# Patient Record
Sex: Female | Born: 1990 | Hispanic: No | Marital: Single | Smoking: Former smoker
Health system: Southern US, Community
[De-identification: ages and names within clinical notes are randomized; demographics above are authoritative.]

## PROBLEM LIST (undated history)

## (undated) ENCOUNTER — Inpatient Hospital Stay (HOSPITAL_COMMUNITY): Payer: Self-pay

## (undated) DIAGNOSIS — R87629 Unspecified abnormal cytological findings in specimens from vagina: Secondary | ICD-10-CM

## (undated) HISTORY — DX: Unspecified abnormal cytological findings in specimens from vagina: R87.629

## (undated) HISTORY — PX: NO PAST SURGERIES: SHX2092

---

## 2014-01-19 ENCOUNTER — Emergency Department (HOSPITAL_COMMUNITY)
Admission: EM | Admit: 2014-01-19 | Discharge: 2014-01-19 | Disposition: A | Discharge status: Home / Self Care | Payer: Self-pay | Attending: Emergency Medicine | Admitting: Emergency Medicine

## 2014-01-19 ENCOUNTER — Encounter (HOSPITAL_COMMUNITY): Payer: Self-pay | Admitting: *Deleted

## 2014-01-19 DIAGNOSIS — J029 Acute pharyngitis, unspecified: Secondary | ICD-10-CM

## 2014-01-19 DIAGNOSIS — J039 Acute tonsillitis, unspecified: Secondary | ICD-10-CM

## 2014-01-19 LAB — RAPID STREP SCREEN (MED CTR MEBANE ONLY): Streptococcus, Group A Screen (Direct): NEGATIVE

## 2014-01-19 MED ORDER — CHLORASEPTIC SALT WATER GARGLE MT LIQD: OROMUCOSAL | Status: DC

## 2014-01-19 NOTE — ED Notes (Signed)
Pt reports sore throat x2 days. Pain 9/10.

## 2014-01-19 NOTE — ED Provider Notes (Signed)
CSN: [id number]     Arrival date & time 01/19/14  1507 History  This chart was scribed for non-physician practitioner working with [name], MD by Richarda Overlieichard Holland, ED Scribe. This patient was seen in room WTR5/WTR5 and the patient's care was started at 3:34 PM.      Chief Complaint  Patient presents with  . Sore Throat   The history is provided by the patient. No language interpreter was used.   HPI Comments: Robin Webster is a 23 y.o. female who presents to the Emergency Department complaining of constant, gradually worsening sore throat that started last night. Pt reports her pain is a 9/10, worse with swallowing. She reports associated cold chills as symptom. She states she has not taken any medication. She denies fever, congestion and cough as symptoms.    History reviewed. No pertinent past medical history. No past surgical history on file. History reviewed. No pertinent family history. History  Substance Use Topics  . Smoking status: Not on file  . Smokeless tobacco: Not on file  . Alcohol Use: Not on file   OB History    No data available     Review of Systems  Constitutional: Positive for chills.  HENT: Positive for sore throat.   All other systems reviewed and are negative.     Allergies  Review of patient's allergies indicates no known allergies.  Home Medications   Prior to Admission medications   Medication Sig Start Date End Date Taking? Authorizing Provider  Misc. Throat Products [facility](CHLORASEPTIC SALT WATER GARGLE) LIQD Swish and gargle, spit. 01/19/14   Lucrezia Dehne M Karlee Staff, PA-C   BP 114/68 mmHg  Pulse 98  Temp(Src) 99.2 F (37.3 C) (Oral)  Resp 16  SpO2 99%  LMP 12/29/2013 Physical Exam  Constitutional: She is oriented to person, place, and time. She appears well-developed and well-nourished. No distress.  HENT:  Head: Normocephalic and atraumatic.  Tonsils enlarged and inflamed +1 with few exudate. No uvula swelling. Nasal congestion, mucosal edema,  post-nasal drip.  Eyes: Conjunctivae and EOM are normal.  Neck: Normal range of motion. Neck supple.  Cardiovascular: Normal rate, regular rhythm and normal heart sounds.   Pulmonary/Chest: Effort normal and breath sounds normal. No respiratory distress.  Musculoskeletal: Normal range of motion. She exhibits no edema.  Lymphadenopathy:    She has no cervical adenopathy.  Neurological: She is alert and oriented to person, place, and time. No sensory deficit.  Skin: Skin is warm and dry.  Psychiatric: She has a normal mood and affect. Her behavior is normal.  Nursing note and vitals reviewed.   ED Course  Procedures   DIAGNOSTIC STUDIES: Oxygen Saturation is 99% on RA, normal by my interpretation.    COORDINATION OF CARE: 4:02 PM Discussed treatment plan with pt at bedside and pt agreed to plan.   Labs Review Labs Reviewed  RAPID STREP SCREEN  CULTURE, GROUP A STREP    Imaging Review No results found.   EKG Interpretation None      MDM   Final diagnoses:  Tonsillitis  Sore throat   Patient presenting with sore throat. Rapid strep negative. She does not meet [facility] criteria for strep throat treatment. Advised Chloraseptic saltwater gargles, nasal saline, ibuprofen. Stable for discharge. Return precautions given. Patient states understanding of treatment care plan and is agreeable.  I personally performed the services described in this documentation, which was scribed in my presence. The recorded information has been reviewed and is accurate.    Melina Schoolsobyn  Marijo FileM Medhansh Brinkmeier, PA-C 01/19/14 1614  [name], MD 01/19/14 [phone]

## 2014-01-19 NOTE — Discharge Instructions (Signed)
Use Chloraseptic saltwater gargles along with nasal saline as directed. Stay well-hydrated.  Sore Throat A sore throat is pain, burning, irritation, or scratchiness of the throat. There is often pain or tenderness when swallowing or talking. A sore throat may be accompanied by other symptoms, such as coughing, sneezing, fever, and swollen neck glands. A sore throat is often the first sign of another sickness, such as a cold, flu, strep throat, or mononucleosis (commonly known as mono). Most sore throats go away without medical treatment. CAUSES  The most common causes of a sore throat include:  A viral infection, such as a cold, flu, or mono.  A bacterial infection, such as strep throat, tonsillitis, or whooping cough.  Seasonal allergies.  Dryness in the air.  Irritants, such as smoke or pollution.  Gastroesophageal reflux disease (GERD). HOME CARE INSTRUCTIONS   Only take over-the-counter medicines as directed by your caregiver.  Drink enough fluids to keep your urine clear or pale yellow.  Rest as needed.  Try using throat sprays, lozenges, or sucking on hard candy to ease any pain (if older than 4 years or as directed).  Sip warm liquids, such as broth, herbal tea, or warm water with honey to relieve pain temporarily. You may also eat or drink cold or frozen liquids such as frozen ice pops.  Gargle with salt water (mix 1 tsp salt with 8 oz of water).  Do not smoke and avoid secondhand smoke.  Put a cool-mist humidifier in your bedroom at night to moisten the air. You can also turn on a hot shower and sit in the bathroom with the door closed for 5-10 minutes. SEEK IMMEDIATE MEDICAL CARE IF:  You have difficulty breathing.  You are unable to swallow fluids, soft foods, or your saliva.  You have increased swelling in the throat.  Your sore throat does not get better in 7 days.  You have nausea and vomiting.  You have a fever or persistent symptoms for more than 2-3  days.  You have a fever and your symptoms suddenly get worse. MAKE SURE YOU:   Understand these instructions.  Will watch your condition.  Will get help right away if you are not doing well or get worse. Document Released: 04/11/2004 Document Revised: 02/19/2012 Document Reviewed: 11/10/2011 [facility] Patient Information 2015 [employer], [place]. This information is not intended to replace advice given to you by your health care provider. Make sure you discuss any questions you have with your health care provider.  Tonsillitis Tonsillitis is an infection of the throat that causes the tonsils to become red, tender, and swollen. Tonsils are collections of lymphoid tissue at the back of the throat. Each tonsil has crevices (crypts). Tonsils help fight nose and throat infections and keep infection from spreading to other parts of the body for the first 18 months of life.  CAUSES Sudden (acute) tonsillitis is usually caused by infection with streptococcal bacteria. Long-lasting (chronic) tonsillitis occurs when the crypts of the tonsils become filled with pieces of food and bacteria, which makes it easy for the tonsils to become repeatedly infected. SYMPTOMS  Symptoms of tonsillitis include:  A sore throat, with possible difficulty swallowing.  White patches on the tonsils.  Fever.  Tiredness.  New episodes of snoring during sleep, when you did not snore before.  Small, foul-smelling, yellowish-white pieces of material (tonsilloliths) that you occasionally cough up or spit out. The tonsilloliths can also cause you to have bad breath. DIAGNOSIS Tonsillitis can be diagnosed through a  physical exam. Diagnosis can be confirmed with the results of lab tests, including a throat culture. TREATMENT  The goals of tonsillitis treatment include the reduction of the severity and duration of symptoms and prevention of associated conditions. Symptoms of tonsillitis can be improved with the use of steroids  to reduce the swelling. Tonsillitis caused by bacteria can be treated with antibiotic medicines. Usually, treatment with antibiotic medicines is started before the cause of the tonsillitis is known. However, if it is determined that the cause is not bacterial, antibiotic medicines will not treat the tonsillitis. If attacks of tonsillitis are severe and frequent, your health care provider may recommend surgery to remove the tonsils (tonsillectomy). HOME CARE INSTRUCTIONS   Rest as much as possible and get plenty of sleep.  Drink plenty of fluids. While the throat is very sore, eat soft foods or liquids, such as sherbet, soups, or instant breakfast drinks.  Eat frozen ice pops.  Gargle with a warm or cold liquid to help soothe the throat. Mix 1/4 teaspoon of salt and 1/4 teaspoon of baking soda in 8 oz of water. SEEK MEDICAL CARE IF:   Large, tender lumps develop in your neck.  A rash develops.  A green, yellow-brown, or bloody substance is coughed up.  You are unable to swallow liquids or food for 24 hours.  You notice that only one of the tonsils is swollen. SEEK IMMEDIATE MEDICAL CARE IF:   You develop any new symptoms such as vomiting, severe headache, stiff neck, chest pain, or trouble breathing or swallowing.  You have severe throat pain along with drooling or voice changes.  You have severe pain, unrelieved with recommended medications.  You are unable to fully open the mouth.  You develop redness, swelling, or severe pain anywhere in the neck.  You have a fever. MAKE SURE YOU:   Understand these instructions.  Will watch your condition.  Will get help right away if you are not doing well or get worse. Document Released: 12/12/2004 Document Revised: 07/19/2013 Document Reviewed: 08/21/2012 [facility] Patient Information 2015 [employer], [place]. This information is not intended to replace advice given to you by your health care provider. Make sure you discuss any questions  you have with your health care provider.

## 2014-01-21 LAB — CULTURE, GROUP A STREP

## 2014-10-01 ENCOUNTER — Encounter (HOSPITAL_COMMUNITY): Payer: Self-pay | Admitting: *Deleted

## 2014-10-01 ENCOUNTER — Emergency Department (HOSPITAL_COMMUNITY)
Admission: EM | Admit: 2014-10-01 | Discharge: 2014-10-01 | Disposition: A | Discharge status: Home / Self Care | Payer: Self-pay | Attending: Emergency Medicine | Admitting: Emergency Medicine

## 2014-10-01 DIAGNOSIS — S199XXA Unspecified injury of neck, initial encounter: Secondary | ICD-10-CM | POA: Insufficient documentation

## 2014-10-01 DIAGNOSIS — Y999 Unspecified external cause status: Secondary | ICD-10-CM | POA: Insufficient documentation

## 2014-10-01 DIAGNOSIS — S3991XA Unspecified injury of abdomen, initial encounter: Secondary | ICD-10-CM | POA: Insufficient documentation

## 2014-10-01 DIAGNOSIS — Y939 Activity, unspecified: Secondary | ICD-10-CM | POA: Insufficient documentation

## 2014-10-01 DIAGNOSIS — Y92410 Unspecified street and highway as the place of occurrence of the external cause: Secondary | ICD-10-CM | POA: Insufficient documentation

## 2014-10-01 DIAGNOSIS — S0990XA Unspecified injury of head, initial encounter: Secondary | ICD-10-CM | POA: Insufficient documentation

## 2014-10-01 DIAGNOSIS — S299XXA Unspecified injury of thorax, initial encounter: Secondary | ICD-10-CM | POA: Insufficient documentation

## 2014-10-01 MED ORDER — METHOCARBAMOL 500 MG PO TABS
500.0000 mg | ORAL_TABLET | Freq: Once | ORAL | Status: AC
  Administered 2014-10-01: 500 mg via ORAL
  Filled 2014-10-01: qty 1

## 2014-10-01 MED ORDER — NAPROXEN 500 MG PO TABS: 500.0000 mg | ORAL_TABLET | Freq: Two times a day (BID) | ORAL | Status: DC

## 2014-10-01 MED ORDER — METHOCARBAMOL 500 MG PO TABS: 500.0000 mg | ORAL_TABLET | Freq: Two times a day (BID) | ORAL | Status: DC

## 2014-10-01 MED ORDER — OXYCODONE-ACETAMINOPHEN 5-325 MG PO TABS: 2.0000 | ORAL_TABLET | ORAL | Status: DC | PRN

## 2014-10-01 MED ORDER — OXYCODONE-ACETAMINOPHEN 5-325 MG PO TABS
2.0000 | ORAL_TABLET | Freq: Once | ORAL | Status: AC
  Administered 2014-10-01: 2 via ORAL
  Filled 2014-10-01: qty 2
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
## 2015-01-18 ENCOUNTER — Encounter (HOSPITAL_COMMUNITY): Payer: Self-pay | Admitting: *Deleted

## 2015-01-18 ENCOUNTER — Emergency Department (HOSPITAL_COMMUNITY)
Admission: EM | Admit: 2015-01-18 | Discharge: 2015-01-18 | Disposition: A | Discharge status: Home / Self Care | Payer: Self-pay | Attending: Emergency Medicine | Admitting: Emergency Medicine

## 2015-01-18 DIAGNOSIS — B9689 Other specified bacterial agents as the cause of diseases classified elsewhere: Secondary | ICD-10-CM

## 2015-01-18 DIAGNOSIS — N760 Acute vaginitis: Secondary | ICD-10-CM | POA: Insufficient documentation

## 2015-01-18 DIAGNOSIS — Z3202 Encounter for pregnancy test, result negative: Secondary | ICD-10-CM | POA: Insufficient documentation

## 2015-01-18 DIAGNOSIS — A5901 Trichomonal vulvovaginitis: Secondary | ICD-10-CM | POA: Insufficient documentation

## 2015-01-18 LAB — URINE MICROSCOPIC-ADD ON

## 2015-01-18 LAB — URINALYSIS, ROUTINE W REFLEX MICROSCOPIC
Bilirubin Urine: NEGATIVE
Glucose, UA: NEGATIVE mg/dL
Hgb urine dipstick: NEGATIVE
Ketones, ur: NEGATIVE mg/dL
Nitrite: NEGATIVE
Protein, ur: NEGATIVE mg/dL
Specific Gravity, Urine: 1.018 (ref 1.005–1.030)
Urobilinogen, UA: 1 mg/dL (ref 0.0–1.0)
pH: 6.5 (ref 5.0–8.0)

## 2015-01-18 LAB — POC URINE PREG, ED: Preg Test, Ur: NEGATIVE

## 2015-01-18 LAB — WET PREP, GENITAL: Yeast Wet Prep HPF POC: NONE SEEN

## 2015-01-18 MED ORDER — METRONIDAZOLE 500 MG PO TABS: 500.0000 mg | ORAL_TABLET | Freq: Two times a day (BID) | ORAL | Status: DC

## 2015-01-18 NOTE — ED Provider Notes (Signed)
CSN: [id number]     Arrival date & time 01/18/15  1926 History  By signing my name below, I, Robin Webster, attest that this documentation has been prepared under the direction and in the presence of Carrington Mullenax, PA-C. Electronically Signed: Ronney LionSuzanne Webster, ED Scribe. 01/18/2015. 8:23 PM.    Chief Complaint  Patient presents with  . Vaginal Itching   The history is provided by the patient. No language interpreter was used.    HPI Comments: Robin Webster is a 24 y.o. female who presents to the Emergency Department complaining of constant, gradually worsening vaginal itching, redness, and pain that began 3 days ago. Walking exacerbates her pain. She denies dysuria but states that when she urinates and the urine stream touches the irritated area, it makes her pain worse. Nothing makes her pain better. She denies trying any prior treatments or medications for this. She wore a new pair of lace underwear immediately before the irritation began. She also denies vaginal discharge or bleeding, abdominal pain, nausea, vomiting, myalgias, chest pain, or fever.   History reviewed. No pertinent past medical history. History reviewed. No pertinent past surgical history. No family history on file. Social History  Substance Use Topics  . Smoking status: Never Smoker   . Smokeless tobacco: None  . Alcohol Use: Yes   OB History    No data available     Review of Systems  Constitutional: Negative for fever, chills and diaphoresis.  Cardiovascular: Negative for chest pain.  Gastrointestinal: Negative for nausea, vomiting and abdominal pain.  Genitourinary: Positive for vaginal pain. Negative for dysuria, hematuria, flank pain, vaginal bleeding, vaginal discharge and genital sores.  Musculoskeletal: Negative for myalgias and back pain.  Skin: Negative for rash.  All other systems reviewed and are negative.   Allergies  Review of patient's allergies indicates no known allergies.  Home Medications    Prior to Admission medications   Medication Sig Start Date End Date Taking? Authorizing Provider  methocarbamol (ROBAXIN) 500 MG tablet Take 1 tablet (500 mg total) by mouth 2 (two) times daily. Patient not taking: Reported on 01/18/2015 10/01/14   Catha GosselinHanna Patel-Mills, PA-C  metroNIDAZOLE (FLAGYL) 500 MG tablet Take 1 tablet (500 mg total) by mouth 2 (two) times daily. 01/18/15   Yamaris Cummings, PA-C  Misc. Throat Products [facility](CHLORASEPTIC SALT WATER GARGLE) LIQD Swish and gargle, spit. Patient not taking: Reported on 01/18/2015 01/19/14   Kathrynn Speedobyn M Hess, PA-C  naproxen (NAPROSYN) 500 MG tablet Take 1 tablet (500 mg total) by mouth 2 (two) times daily. Patient not taking: Reported on 01/18/2015 10/01/14   Catha GosselinHanna Patel-Mills, PA-C  oxyCODONE-acetaminophen (PERCOCET/ROXICET) 5-325 MG per tablet Take 2 tablets by mouth every 4 (four) hours as needed for severe pain. Patient not taking: Reported on 01/18/2015 10/01/14   Hanna Patel-Mills, PA-C   BP 131/92 mmHg  Pulse 76  Temp(Src) 98.3 F (36.8 C) (Oral)  Resp 17  Ht 5\' 5"  (1.651 m)  Wt 156 lb 14.4 oz (71.169 kg)  BMI 26.11 kg/m2  SpO2 100%  LMP 12/18/2014 Physical Exam  Constitutional: She is oriented to person, place, and time. She appears well-developed and well-nourished. No distress.  HENT:  Head: Normocephalic and atraumatic.  Eyes: Conjunctivae and EOM are normal.  Neck: Neck supple. No tracheal deviation present.  Cardiovascular: Normal rate and normal heart sounds.   Pulmonary/Chest: Effort normal and breath sounds normal. No respiratory distress. She has no wheezes.  Abdominal: Soft. She exhibits no distension. There is no tenderness.  Genitourinary:  Uterus normal.    There is no rash or lesion on the right labia. There is no rash or lesion on the left labia. Cervix exhibits no motion tenderness, no discharge and no friability. Right adnexum displays no mass and no tenderness. Left adnexum displays no mass and no tenderness. There is  erythema and tenderness in the vagina. No bleeding in the vagina. Vaginal discharge (white, thin) found.  Musculoskeletal: Normal range of motion.  Moves all extremities spontaneously. Walks with a steady gait.   Neurological: She is alert and oriented to person, place, and time.  Skin: Skin is warm and dry.  Psychiatric: She has a normal mood and affect. Her behavior is normal.  Nursing note and vitals reviewed.   ED Course  Procedures (including critical care time)  DIAGNOSTIC STUDIES: Oxygen Saturation is 100% on RA, normal by my interpretation.    COORDINATION OF CARE: 8:23 PM - Discussed treatment plan with pt at bedside which includes pelvic exam. Pt verbalized understanding and agreed to plan.   Labs Review Labs Reviewed  WET PREP, GENITAL - Abnormal; Notable for the following:    Trich, Wet Prep FEW (*)    Clue Cells Wet Prep HPF POC FEW (*)    WBC, Wet Prep HPF POC FEW (*)    All other components within normal limits  URINALYSIS, ROUTINE W REFLEX MICROSCOPIC (NOT AT [facility]) - Abnormal; Notable for the following:    APPearance CLOUDY (*)    Leukocytes, UA SMALL (*)    All other components within normal limits  URINE MICROSCOPIC-ADD ON  POC URINE PREG, ED  GC/CHLAMYDIA PROBE AMP ([place]) NOT AT [facility]    Imaging Review No results found. I have personally reviewed and evaluated these images and lab results as part of my medical decision-making.  MDM   Final diagnoses:  BV (bacterial vaginosis)  Trichomonas vaginalis (TV) infection   Pt presenting with vaginal pain and pruritus x 3 days. Denies associated dysuria, vaginal discharge, hematuria or lesion. She states that she looked in the mirror and noted redness around her introitus. No other associated symptoms. No systemic symptoms. VSS. Pt is nontoxic appearing. Abdomen is soft, non-tender. No peritoneal signs. On exam, there is some erythema around the inferior introitus. Appears to be some mild irritation, likely  from her lace underwear. No vesicles or papules or distinct lesions. Also some thin, white discharge. No CMT. Wet prep positive for BV and trich. Will treat with flagyl. Discussed wearing breathable, cotton underwear that does not irritate her vagina. Given resource guide to set up PCP for follow up if symptoms persist or worsen. Return precautions given in discharge paperwork and discussed with pt at bedside. Pt stable for discharge   I personally performed the services described in this documentation, which was scribed in my presence. The recorded information has been reviewed and is accurate.     Rolm Gala Lacie Landry, PA-C 01/19/15 0014  Leta Baptist, MD 01/20/15 [phone]

## 2015-01-18 NOTE — ED Notes (Signed)
Pt states that she has irrigation and rash to vaginal area. Pt denies any vaginal discharge.

## 2015-01-18 NOTE — Discharge Instructions (Signed)
- Take flagyl twice a day for 7 days - Wear cotton underwear and keep area clean. Avoid bodywash with fragrance or dyes - Use the resource guide to find a PCP to follow up with if irritation persists   Bacterial Vaginosis Bacterial vaginosis is a vaginal infection that occurs when the normal balance of bacteria in the vagina is disrupted. It results from an overgrowth of certain bacteria. This is the most common vaginal infection in women of childbearing age. Treatment is important to prevent complications, especially in pregnant women, as it can cause a premature delivery. CAUSES  Bacterial vaginosis is caused by an increase in harmful bacteria that are normally present in smaller amounts in the vagina. Several different kinds of bacteria can cause bacterial vaginosis. However, the reason that the condition develops is not fully understood. RISK FACTORS Certain activities or behaviors can put you at an increased risk of developing bacterial vaginosis, including:  Having a new sex partner or multiple sex partners.  Douching.  Using an intrauterine device (IUD) for contraception. Women do not get bacterial vaginosis from toilet seats, bedding, swimming pools, or contact with objects around them. SIGNS AND SYMPTOMS  Some women with bacterial vaginosis have no signs or symptoms. Common symptoms include:  Grey vaginal discharge.  A fishlike odor with discharge, especially after sexual intercourse.  Itching or burning of the vagina and vulva.  Burning or pain with urination. DIAGNOSIS  Your health care provider will take a medical history and examine the vagina for signs of bacterial vaginosis. A sample of vaginal fluid may be taken. Your health care provider will look at this sample under a microscope to check for bacteria and abnormal cells. A vaginal pH test may also be done.  TREATMENT  Bacterial vaginosis may be treated with antibiotic medicines. These may be given in the form of a  pill or a vaginal cream. A second round of antibiotics may be prescribed if the condition comes back after treatment. Because bacterial vaginosis increases your risk for sexually transmitted diseases, getting treated can help reduce your risk for chlamydia, gonorrhea, HIV, and herpes. HOME CARE INSTRUCTIONS   Only take over-the-counter or prescription medicines as directed by your health care provider.  If antibiotic medicine was prescribed, take it as directed. Make sure you finish it even if you start to feel better.  Tell all sexual partners that you have a vaginal infection. They should see their health care provider and be treated if they have problems, such as a mild rash or itching.  During treatment, it is important that you follow these instructions:  Avoid sexual activity or use condoms correctly.  Do not douche.  Avoid alcohol as directed by your health care provider.  Avoid breastfeeding as directed by your health care provider. SEEK MEDICAL CARE IF:   Your symptoms are not improving after 3 days of treatment.  You have increased discharge or pain.  You have a fever. MAKE SURE YOU:   Understand these instructions.  Will watch your condition.  Will get help right away if you are not doing well or get worse. FOR MORE INFORMATION  Centers for Disease Control and Prevention, Division of STD Prevention: SolutionApps.co.za American Sexual Health Association (ASHA): www.ashastd.org    This information is not intended to replace advice given to you by your health care provider. Make sure you discuss any questions you have with your health care provider.   Document Released: 03/04/2005 Document Revised: 03/25/2014 Document Reviewed: 10/14/2012 Elsevier Interactive Patient  Education 2016 [employer].  Trichomoniasis Trichomoniasis is an infection caused by an organism called Trichomonas. The infection can affect both women and men. In women, the outer female genitalia and  the vagina are affected. In men, the penis is mainly affected, but the prostate and other reproductive organs can also be involved. Trichomoniasis is a sexually transmitted infection (STI) and is most often passed to another person through sexual contact.  RISK FACTORS  Having unprotected sexual intercourse.  Having sexual intercourse with an infected partner. SIGNS AND SYMPTOMS  Symptoms of trichomoniasis in women include:  Abnormal gray-green frothy vaginal discharge.  Itching and irritation of the vagina.  Itching and irritation of the area outside the vagina. Symptoms of trichomoniasis in men include:   Penile discharge with or without pain.  Pain during urination. This results from inflammation of the urethra. DIAGNOSIS  Trichomoniasis may be found during a Pap test or physical exam. Your health care provider may use one of the following methods to help diagnose this infection:  Testing the pH of the vagina with a test tape.  Using a vaginal swab test that checks for the Trichomonas organism. A test is available that provides results within a few minutes.  Examining a urine sample.  Testing vaginal secretions. Your health care provider may test you for other STIs, including HIV. TREATMENT   You may be given medicine to fight the infection. Women should inform their health care provider if they could be or are pregnant. Some medicines used to treat the infection should not be taken during pregnancy.  Your health care provider may recommend over-the-counter medicines or creams to decrease itching or irritation.  Your sexual partner will need to be treated if infected.  Your health care provider may test you for infection again 3 months after treatment. HOME CARE INSTRUCTIONS   Take medicines only as directed by your health care provider.  Take over-the-counter medicine for itching or irritation as directed by your health care provider.  Do not have sexual intercourse  while you have the infection.  Women should not douche or wear tampons while they have the infection.  Discuss your infection with your partner. Your partner may have gotten the infection from you, or you may have gotten it from your partner.  Have your sex partner get examined and treated if necessary.  Practice safe, informed, and protected sex.  See your health care provider for other STI testing. SEEK MEDICAL CARE IF:   You still have symptoms after you finish your medicine.  You develop abdominal pain.  You have pain when you urinate.  You have bleeding after sexual intercourse.  You develop a rash.  Your medicine makes you sick or makes you throw up (vomit). MAKE SURE YOU:  Understand these instructions.  Will watch your condition.  Will get help right away if you are not doing well or get worse.   This information is not intended to replace advice given to you by your health care provider. Make sure you discuss any questions you have with your health care provider.   Document Released: 08/28/2000 Document Revised: 03/25/2014 Document Reviewed: 12/14/2012 Elsevier Interactive Patient Education 2016 [employer].   Emergency Department Resource Guide 1) Find a Doctor and Pay Out of Pocket Although you won't have to find out who is covered by your insurance plan, it is a good idea to ask around and get recommendations. You will then need to call the office and see if the doctor you  have chosen will accept you as a new patient and what types of options they offer for patients who are self-pay. Some doctors offer discounts or will set up payment plans for their patients who do not have insurance, but you will need to ask so you aren't surprised when you get to your appointment.  2) Contact Your Local Health Department Not all health departments have doctors that can see patients for sick visits, but many do, so it is worth a call to see if yours does. If you don't know  where your local health department is, you can check in your phone book. The CDC also has a tool to help you locate your state's health department, and many state websites also have listings of all of their local health departments.  3) Find a Walk-in Clinic If your illness is not likely to be very severe or complicated, you may want to try a walk in clinic. These are popping up all over the country in pharmacies, drugstores, and shopping centers. They're usually staffed by nurse practitioners or physician assistants that have been trained to treat common illnesses and complaints. They're usually fairly quick and inexpensive. However, if you have serious medical issues or chronic medical problems, these are probably not your best option.  No Primary Care Doctor: - Call Health Connect at  [phone] - they can help you locate a primary care doctor that  accepts your insurance, provides certain services, etc. - Physician Referral Service- [phone]  Chronic Pain Problems: Organization         Address  Phone   Notes  [facility]  [phone] Patients need to be referred by their primary care doctor.   Medication Assistance: Organization         Address  Phone   Notes  [facility] Medication [facility] [address] [place], [place] [address] [phone] --Must be a resident of [facility] [place] -- Must have NO insurance coverage whatsoever (no Medicaid/ Medicare, etc.) -- The pt. MUST have a primary care doctor that directs their care regularly and follows them in the community   MedAssist  [phone]   [employer]  [phone]    Agencies that provide inexpensive medical care: Organization         Address  Phone   Notes  [facility]  [phone]   [facility]    [phone]   [facility] [address] [place], [place] [address] [phone]   [facility]  [address], [place] [phone]   Planned Parenthood    [phone]) [phone]   [facility]    [phone]   [facility]  [address], [place] Phone:  [phone]) [phone], Fax:  [phone] Hours of Operation:  9 am - 6 pm, M-F.  Also accepts Medicaid/Medicare and self-pay.  [facility]  [address],  Phone: [phone], Fax: [phone]. Hours of Operation:  8:30 am - 5:30 pm, M-F.  Also accepts Medicaid and self-pay.  [facility] [place] [address], [place] Phone: [phone]   [facility] [address], [place], [place] [phone], Ext. [phone] Mondays & Thursdays: 7-9 AM.  First 15 patients are seen on a first come, first serve basis.    Medicaid-accepting [facility] Providers:  Organization  Address  Phone   Notes  [facility] [address], [place] [phone] Also accepts self-pay patients.  [facility] [address], [place]  [phone]) [phone]   [facility] [address], [place] [phone]   [facility] [address] [place] [address], [place] [phone]) [phone]   Renaye RakersVeita Bland [address], [place]   [phone] Only accepts [place] Access [place] patients after they have their name applied to their card.   Self-Pay (no insurance) in [facility]:  Organization         Address  Phone   Notes  Sickle Cell Patients, [facility] [address], [place] [phone]   [facility] [address], [place] [phone]   [facility] [place]  [address], [place] [phone]   [facility]/Dr. Osei-Bonsu  [address] [place] [address], [place] or [phone], [place] [phone] Phone number for both  [place] and [place] locations is the same.  [facility] [address], [place] [phone]   [facility] [address] [place] [address], [place] or [address] [phone]) [phone]   [facility] [address], [place] [phone]) [phone], phone; [phone], fax Sees patients 1st and 3rd Saturday of every month.  Must not qualify for public or private insurance (i.e. Medicaid, Medicare, [place] Health Choice, Veterans' Benefits)  Household income should be no more than 200% of the poverty level The clinic cannot treat you if you are pregnant or think you are pregnant  Sexually transmitted diseases are not treated at the clinic.    Dental Care: Organization         Address  Phone  Notes  [facility] Department of [facility] [address], [place] [phone]) [phone] Accepts children up to age 26 who are enrolled in [place] or [place] Health Choice; pregnant women with a Medicaid card; and children who have applied for Medicaid or [place] Health Choice, but were declined, whose parents can pay a reduced fee at time of service.  [facility] [place]  [address], [place] [phone] Accepts children up to age 26 who are enrolled in [place] or [place] Health Choice; pregnant women with a Medicaid card; and children who have applied for Medicaid or [place] Health Choice, but were declined, whose parents can pay a reduced fee at time of service.  [facility]  [address], [place] [phone] Patients are seen by appointment only. Walk-ins are not accepted. [facility] will see patients 26 years of age and older. Monday - Tuesday (8am-5pm) Most Wednesdays (8:30-5pm) $30 per visit, cash only  [facility]  [address], [facility] [place] [facility] [phone] Patients are seen by appointment only. Walk-ins are not  accepted. [facility] will see patients 26 years of age and older. One Wednesday Evening (Monthly: Volunteer Based).  $30 per visit, cash only  [facility]  [phone]) [phone] for adults; Children under age 214, call Graduate Pediatric Dentistry at [phone]. Children aged 714-14, please call [phone] to request a pediatric application.  Dental services are provided in all areas of dental care including  fillings, crowns and bridges, complete and partial dentures, implants, gum treatment, root canals, and extractions. Preventive care is also provided. Treatment is provided to both adults and children. Patients are selected via a lottery and there is often a waiting list.   [facility] [address], [place]  [phone] [email or web address]   [facility] [address] [place], [place] [phone], Ext. [phone] Second and Fourth Thursday of each month, opens at 6:30 AM; Clinic ends at 9 AM.  Patients are seen on a first-come first-served basis, and a limited number are seen during each clinic.   [facility]  [address] [place], [place] [phone]   Eligibility Requirements You must have lived in [place], [place], or [place] counties for at least the last three months.   You cannot be eligible for state or federal sponsored [place], including [employer], [place], or [employer].   You generally cannot be eligible for healthcare insurance through your employer.    How to apply: Eligibility screenings are held every Tuesday and Wednesday afternoon from 1:00 pm until 4:00 pm. You do not need an appointment for the interview!  [facility] [address] [place], [place] [phone]   [facility] Health Department  [phone]   [facility] Health Department  [phone]   [facility] Health Department  [phone]    Behavioral Health Resources in the  Community: Intensive Outpatient Programs Organization         Address  Phone  Notes  [facility] [address] [place], [place] [phone]   [facility] Outpatient [address], [place], [place] [phone]   [facility] [address] [place], [place]  [phone]   [facility] [address]  [place], [place] [phone] or [phone]   Substance Abuse Resources Organization         Address  Phone  Notes  [facility] Drug Services  [phone]   [facility]  [phone]   [facility]  [phone]   Floydene Flock  [phone]   Residential & Outpatient Substance Abuse Program  [phone]   Psychological Services Organization         Address  Phone  Notes  [facility]  [phone]   [facility] Services  [phone]   [facility] [address], [place] [phone] or [phone]    Mobile Crisis Teams Organization         Address  Phone  Notes  [facility], Mobile Crisis Care Unit  [phone]   [facility]  [address] [place], [place] [phone]   Doristine Locks [address] [place] [phone]    Self-Help/Support Groups Organization         Address  Phone             Notes  [facility] [place] - variety of support groups  [phone]- [phone] Call for more information  Narcotics Anonymous (NA), [facility] [address], [employer] [place]  2 meetings at this location   Statistician         Address  Phone  Notes  [facility] [address],    [place]  [phone]   [facility]  [address], [place] [address], [place], [place] [phone]   [facility] [address] [place], [place] [phone] Admissions: 8am-3pm M-F  [facility] [address]    [place], [place] [phone]   [facility] [address] [place], [place], [place] [phone]   [facility] [address],  [place], [place] [phone]   [facility] - Intensive Outpatient [address], [place], [place] [phone]   [facility] ([facility] [address] [place].,  [place], [place] [phone] or [phone]   [facility] ([facility]) [address], [place], [place] [phone] Accepts Medicaid  [facility] [address]  [place] [phone] Substance Abuse/Addiction Treatment   [facility] Organization         Address  Phone  Notes  [facility]  [phone]   Angie Fava, PhD [address]vin Kn[address] [place], [place]   [phone] or [phone]   [facility] Behavioral   [address] [place], [place] [phone]   [facility] [address], [place], [place] [phone] Insurance/Medicaid/sponsorship through [facility] and Families [address]                                    [place], [place] [phone] Therapy/tele-psych/case  [facility] [address], [place] [phone]    Dr. Lolly Mustache  [phone]   [facility] [place]  United Way [facility] Dept. 1) [address] [place] 2) [address], [place] 3)  [address], [place] [phone]  [phone]   [facility] Child Abuse Hotline (760)[phone] or [phone] (After Hours)

## 2015-01-20 LAB — GC/CHLAMYDIA PROBE AMP (~~LOC~~) NOT AT ARMC
Chlamydia: NEGATIVE
Neisseria Gonorrhea: NEGATIVE

## 2016-11-21 ENCOUNTER — Encounter (HOSPITAL_COMMUNITY): Payer: Self-pay | Admitting: Emergency Medicine

## 2016-11-21 ENCOUNTER — Emergency Department (HOSPITAL_COMMUNITY)
Admission: EM | Admit: 2016-11-21 | Discharge: 2016-11-21 | Disposition: A | Discharge status: Home / Self Care | Payer: Self-pay | Attending: Emergency Medicine | Admitting: Emergency Medicine

## 2016-11-21 DIAGNOSIS — J0100 Acute maxillary sinusitis, unspecified: Secondary | ICD-10-CM

## 2016-11-21 DIAGNOSIS — Z79899 Other long term (current) drug therapy: Secondary | ICD-10-CM | POA: Insufficient documentation

## 2016-11-21 MED ORDER — FLUTICASONE PROPIONATE 50 MCG/ACT NA SUSP: 1.0000 | Freq: Every day | NASAL | 0 refills | Status: DC

## 2016-11-21 NOTE — ED Triage Notes (Signed)
Pt reports having right ear pain that started on Monday.  Endorses that it began to radiate to her teeth on the right side yesterday.  Denies any drainage or impaired hearing.  States she cleans her ears regularly with q-tips.  Also endorses having a chipped tooth on the right side but denies infection.  Patient is [redacted] weeks pregnant and has first appointment with OBGYN soon.  Has only been taking tylenol for pain at home.

## 2016-11-21 NOTE — ED Provider Notes (Signed)
[facility]-EMERGENCY DEPT Provider Note   CSN: [id number] Arrival date & time: 11/21/16  1751     History   Chief Complaint Chief Complaint  Patient presents with  . Otalgia  . Dental Pain    HPI Robin Webster is a 26 y.o. female.  HPI  26 y.o. female presents to the Emergency Department today due to right ear pain since Monday. Notes radiation to teeth on right side. Denies drainage or impairment in hearing. No swelling. Endorses having a chipped tooth on the right side but denies infection. Of note, patient is [redacted] weeks pregnant and has first appointment with OBGYN soon.  Has only been taking tylenol for pain at home. Notes associated sinus congestion on right maxillary sinus with ear pressure. No fevers. No cough/congestion. No abdominal pain. No meds PTA. No other symptoms noted.   History reviewed. No pertinent past medical history.  There are no active problems to display for this patient.   History reviewed. No pertinent surgical history.  OB History    Gravida Para Term Preterm AB Living   1             SAB TAB Ectopic Multiple Live Births                   Home Medications    Prior to Admission medications   Medication Sig Start Date End Date Taking? Authorizing Provider  methocarbamol (ROBAXIN) 500 MG tablet Take 1 tablet (500 mg total) by mouth 2 (two) times daily. Patient not taking: Reported on 01/18/2015 10/01/14   Patel-Mills, Lorelle FormosaHanna, PA-C  metroNIDAZOLE (FLAGYL) 500 MG tablet Take 1 tablet (500 mg total) by mouth 2 (two) times daily. 01/18/15   Barrett, Rolm GalaStevi, PA-C  Misc. Throat Products [facility](CHLORASEPTIC SALT WATER GARGLE) LIQD Swish and gargle, spit. Patient not taking: Reported on 01/18/2015 01/19/14   Hess, Nada Boozerobyn M, PA-C  naproxen (NAPROSYN) 500 MG tablet Take 1 tablet (500 mg total) by mouth 2 (two) times daily. Patient not taking: Reported on 01/18/2015 10/01/14   Patel-Mills, Lorelle FormosaHanna, PA-C  oxyCODONE-acetaminophen (PERCOCET/ROXICET) 5-325 MG per tablet Take 2 tablets  by mouth every 4 (four) hours as needed for severe pain. Patient not taking: Reported on 01/18/2015 10/01/14   Catha GosselinPatel-Mills, Hanna, PA-C    Family History No family history on file.  Social History Social History  Substance Use Topics  . Smoking status: Never Smoker  . Smokeless tobacco: Never Used  . Alcohol use Yes     Allergies   Patient has no known allergies.   Review of Systems Review of Systems ROS reviewed and all are negative for acute change except as noted in the HPI.  Physical Exam Updated Vital Signs BP 139/90 (BP Location: Right Arm)   Pulse 87   Temp 98.2 F (36.8 C) (Oral)   Resp 16   LMP 08/24/2016 (Approximate)   SpO2 97%   Physical Exam  Constitutional: She is oriented to person, place, and time. She appears well-developed and well-nourished. No distress.  HENT:  Head: Normocephalic and atraumatic.  Right Ear: Hearing, tympanic membrane, external ear and ear canal normal.  Left Ear: Hearing, tympanic membrane, external ear and ear canal normal.  Nose: Right sinus exhibits maxillary sinus tenderness.  Mouth/Throat: Uvula is midline, oropharynx is clear and moist and mucous membranes are normal. No trismus in the jaw. No oropharyngeal exudate, posterior oropharyngeal erythema or tonsillar abscesses.  No dental abscess. No trismus. No uvula deviation.   Eyes: Pupils are equal, round, and  reactive to light. EOM are normal.  Neck: Normal range of motion. Neck supple. No tracheal deviation present.  Cardiovascular: Normal rate, regular rhythm, S1 normal, S2 normal, normal heart sounds, intact distal pulses and normal pulses.   Pulmonary/Chest: Effort normal and breath sounds normal. No respiratory distress. She has no decreased breath sounds. She has no wheezes. She has no rhonchi. She has no rales.  Abdominal: Normal appearance and bowel sounds are normal. There is no tenderness.  Musculoskeletal: Normal range of motion.  Neurological: She is alert and  oriented to person, place, and time.  Skin: Skin is warm and dry.  Psychiatric: She has a normal mood and affect. Her speech is normal and behavior is normal. Thought content normal.  Nursing note and vitals reviewed.  ED Treatments / Results  Labs (all labs ordered are listed, but only abnormal results are displayed) Labs Reviewed - No data to display  EKG  EKG Interpretation None       Radiology No results found.  Procedures Procedures (including critical care time)  Medications Ordered in ED Medications - No data to display   Initial Impression / Assessment and Plan / ED Course  I have reviewed the triage vital signs and the nursing notes.  Pertinent labs & imaging results that were available during my care of the patient were reviewed by me and considered in my medical decision making (see chart for details).  Final Clinical Impressions(s) / ED Diagnoses     {I have reviewed the relevant previous healthcare records.  {I obtained HPI from historian.   ED Course:  Assessment: Pt is a 26 y.o. female  presents to the Emergency Department today due to right ear pain since Monday. Notes radiation to teeth on right side. Denies drainage or impairment in hearing. No swelling. Endorses having a chipped tooth on the right side but denies infection. Of note, patient is [redacted] weeks pregnant and has first appointment with OBGYN soon.  Has only been taking tylenol for pain at home. Notes associated sinus congestion on right maxillary sinus with ear pressure. No fevers. No cough/congestion. No abdominal pain. No meds PTA. On exam, pt in NAD. Nontoxic/nonseptic appearing. VS. Afebrile. Lungs CTA. Heart RRR. TTP maxillary sinuses on right. Given flonase . Plan is to DC home with follow up to PCP. At time of discharge, Patient is in no acute distress. Vital Signs are stable. Patient is able to ambulate. Patient able to tolerate PO.   Disposition/Plan:  DC Home Additional Verbal discharge  instructions given and discussed with patient.  Pt Instructed to f/u with PCP in the next week for evaluation and treatment of symptoms. Return precautions given Pt acknowledges and agrees with plan  Supervising Physician Vanetta Mulders, MD  Final diagnoses:  Acute non-recurrent maxillary sinusitis    New Prescriptions New Prescriptions   No medications on file     Audry Pili, Cordelia Poche 11/21/16 1941    Vanetta Mulders, MD 11/21/16 2152

## 2016-11-21 NOTE — ED Notes (Signed)
Pt ambulatory to restroom

## 2016-11-21 NOTE — Discharge Instructions (Signed)
Please read and follow all provided instructions.  Your diagnoses today include:  1. Acute non-recurrent maxillary sinusitis     Tests performed today include: Vital signs. See below for your results today.   Medications prescribed:  Take as prescribed   Home care instructions:  Follow any educational materials contained in this packet.  Follow-up instructions: Please follow-up with your primary care provider for further evaluation of symptoms and treatment   Return instructions:  Please return to the Emergency Department if you do not get better, if you get worse, or new symptoms OR  - Fever (temperature greater than 101.87F)  - Bleeding that does not stop with holding pressure to the area    -Severe pain (please note that you may be more sore the day after your accident)  - Chest Pain  - Difficulty breathing  - Severe nausea or vomiting  - Inability to tolerate food and liquids  - Passing out  - Skin becoming red around your wounds  - Change in mental status (confusion or lethargy)  - New numbness or weakness    Please return if you have any other emergent concerns.  Additional Information:  Your vital signs today were: BP 139/90 (BP Location: Right Arm)    Pulse 87    Temp 98.2 F (36.8 C) (Oral)    Resp 16    LMP 08/24/2016 (Approximate)    SpO2 97%  If your blood pressure (BP) was elevated above 135/85 this visit, please have this repeated by your doctor within one month. ---------------

## 2016-11-21 NOTE — ED Notes (Signed)
Pt ambulatory and independent at discharge.  Verbalized understanding of discharge instructions 

## 2016-12-02 ENCOUNTER — Emergency Department (HOSPITAL_COMMUNITY)
Admission: EM | Admit: 2016-12-02 | Discharge: 2016-12-02 | Disposition: A | Discharge status: Home / Self Care | Payer: Self-pay | Attending: Emergency Medicine | Admitting: Emergency Medicine

## 2016-12-02 DIAGNOSIS — O9989 Other specified diseases and conditions complicating pregnancy, childbirth and the puerperium: Secondary | ICD-10-CM | POA: Insufficient documentation

## 2016-12-02 DIAGNOSIS — M545 Low back pain: Secondary | ICD-10-CM | POA: Insufficient documentation

## 2016-12-02 DIAGNOSIS — Z331 Pregnant state, incidental: Secondary | ICD-10-CM | POA: Insufficient documentation

## 2016-12-02 DIAGNOSIS — Z79899 Other long term (current) drug therapy: Secondary | ICD-10-CM | POA: Insufficient documentation

## 2016-12-02 DIAGNOSIS — Z5321 Procedure and treatment not carried out due to patient leaving prior to being seen by health care provider: Secondary | ICD-10-CM | POA: Insufficient documentation

## 2016-12-02 DIAGNOSIS — Z3A01 Less than 8 weeks gestation of pregnancy: Secondary | ICD-10-CM | POA: Insufficient documentation

## 2016-12-03 ENCOUNTER — Emergency Department (HOSPITAL_COMMUNITY)
Admission: EM | Admit: 2016-12-03 | Discharge: 2016-12-03 | Disposition: A | Discharge status: Home / Self Care | Payer: Self-pay | Attending: Emergency Medicine | Admitting: Emergency Medicine

## 2016-12-03 ENCOUNTER — Encounter (HOSPITAL_COMMUNITY): Payer: Self-pay | Admitting: Emergency Medicine

## 2016-12-03 DIAGNOSIS — M545 Low back pain: Secondary | ICD-10-CM

## 2016-12-03 LAB — URINALYSIS, ROUTINE W REFLEX MICROSCOPIC
Bilirubin Urine: NEGATIVE
Glucose, UA: NEGATIVE mg/dL
Hgb urine dipstick: NEGATIVE
Ketones, ur: 5 mg/dL — AB
Leukocytes, UA: NEGATIVE
Nitrite: NEGATIVE
Protein, ur: NEGATIVE mg/dL
Specific Gravity, Urine: 1.021 (ref 1.005–1.030)
pH: 5 (ref 5.0–8.0)

## 2016-12-03 LAB — PREGNANCY, URINE: Preg Test, Ur: POSITIVE — AB

## 2016-12-03 LAB — HCG, QUANTITATIVE, PREGNANCY: hCG, Beta Chain, Quant, S: 18381 m[IU]/mL — ABNORMAL HIGH (ref <5)

## 2016-12-03 LAB — CBG MONITORING, ED: Glucose-Capillary: 121 mg/dL — ABNORMAL HIGH (ref 65–99)

## 2016-12-03 NOTE — ED Provider Notes (Signed)
[facility]-EMERGENCY DEPT Provider Note   CSN: [id number] Arrival date & time: 12/02/16  2337     History   Chief Complaint Chief Complaint  Patient presents with  . Back Pain    HPI Robin Webster is a 26 y.o. female.  Patient presents to the emergency department with chief complaint of low back pain. She is [redacted] weeks pregnant.She states that she has had the low back pain for the past several days. She denies any injuries. She states that her back pain is worsened with palpation and movement. She reports having some tingling sensation in her extremities which is worse at night. She denies any bowel or bladder incontinence. Denies any numbness, or weakness. She has not taken anything for her symptoms. She denies any abdominal pain, vaginal discharge, or bleeding. She is set to follow-up with her OB/GYN later this week.   The history is provided by the patient. No language interpreter was used.    History reviewed. No pertinent past medical history.  There are no active problems to display for this patient.   History reviewed. No pertinent surgical history.  OB History    Gravida Para Term Preterm AB Living   1             SAB TAB Ectopic Multiple Live Births                   Home Medications    Prior to Admission medications   Medication Sig Start Date End Date Taking? Authorizing Provider  acetaminophen (TYLENOL) 500 MG tablet Take 1,000 mg by mouth every 6 (six) hours as needed for mild pain.   Yes [provider]  Prenatal Vit-Fe Fumarate-FA (PRENATAL MULTIVITAMIN) TABS tablet Take 1 tablet by mouth daily at 12 noon.   Yes [provider]  fluticasone (FLONASE) 50 MCG/ACT nasal spray Place 1 spray into both nostrils daily. Patient not taking: Reported on 12/03/2016 11/21/16   Audry Pili, PA-C  methocarbamol (ROBAXIN) 500 MG tablet Take 1 tablet (500 mg total) by mouth 2 (two) times daily. Patient not taking: Reported on 01/18/2015 10/01/14   Patel-Mills,  Lorelle Formosa, PA-C  metroNIDAZOLE (FLAGYL) 500 MG tablet Take 1 tablet (500 mg total) by mouth 2 (two) times daily. Patient not taking: Reported on 12/03/2016 01/18/15   Barrett, Rolm Gala, PA-C  Misc. Throat Products [facility] SALT WATER GARGLE) LIQD Swish and gargle, spit. Patient not taking: Reported on 01/18/2015 01/19/14   Hess, Nada Boozer, PA-C  naproxen (NAPROSYN) 500 MG tablet Take 1 tablet (500 mg total) by mouth 2 (two) times daily. Patient not taking: Reported on 01/18/2015 10/01/14   Patel-Mills, Lorelle Formosa, PA-C  oxyCODONE-acetaminophen (PERCOCET/ROXICET) 5-325 MG per tablet Take 2 tablets by mouth every 4 (four) hours as needed for severe pain. Patient not taking: Reported on 01/18/2015 10/01/14   Catha Gosselin, PA-C    Family History History reviewed. No pertinent family history.  Social History Social History  Substance Use Topics  . Smoking status: Never Smoker  . Smokeless tobacco: Never Used  . Alcohol use No     Allergies   Patient has no known allergies.   Review of Systems Review of Systems  Constitutional: Negative for chills and fever.  Gastrointestinal:       No bowel incontinence  Genitourinary:       No urinary incontinence  Musculoskeletal: Positive for arthralgias, back pain and myalgias.  Neurological:       No saddle anesthesia     Physical Exam Updated  Vital Signs BP 121/75   Pulse 87   Temp 98.9 F (37.2 C) (Oral)   Resp 16   LMP 10/09/2016 (Exact Date)   SpO2 100%   Physical Exam Physical Exam  Constitutional: Pt appears well-developed and well-nourished. No distress.  HENT:  Head: Normocephalic and atraumatic.  Mouth/Throat: Oropharynx is clear and moist. No oropharyngeal exudate.  Eyes: Conjunctivae are normal.  Neck: Normal range of motion. Neck supple.  No meningismus Cardiovascular: Normal rate, regular rhythm and intact distal pulses.   Pulmonary/Chest: Effort normal and breath sounds normal. No respiratory distress. Pt has no wheezes.   Abdominal: No focal abdominal tenderness, no RLQ tenderness or pain at McBurney's point, no RUQ tenderness or Murphy's sign, no left-sided abdominal tenderness, no fluid wave, or signs of peritonitis  Musculoskeletal:  Lumbar paraspinal musclestender to palpation, no bony CTLS spine tenderness, deformity, step-off, or crepitus Lymphadenopathy: Pt has no cervical adenopathy.  Neurological: Pt is alert and oriented Speech is clear and goal oriented, follows commands Normal 5/5 strength in upper and lower extremities bilaterally including dorsiflexion and plantar flexion, strong and equal grip strength Sensation intact Great toe extension intact Moves extremities without ataxia, coordination intact Ankle and knee jerk reflexes intact and symmetrical  Normal gait Normal balance No Clonus Skin: Skin is warm and dry. No rash noted. Pt is not diaphoretic. No erythema.  Psychiatric: Pt has a normal mood and affect. Behavior is normal.  Nursing note and vitals reviewed.   ED Treatments / Results  Labs (all labs ordered are listed, but only abnormal results are displayed) Labs Reviewed  URINALYSIS, ROUTINE W REFLEX MICROSCOPIC - Abnormal; Notable for the following:       Result Value   APPearance HAZY (*)    Ketones, ur 5 (*)    All other components within normal limits  HCG, QUANTITATIVE, PREGNANCY - Abnormal; Notable for the following:    hCG, Beta Chain, Quant, S 18,381 (*)    All other components within normal limits  PREGNANCY, URINE - Abnormal; Notable for the following:    Preg Test, Ur POSITIVE (*)    All other components within normal limits  CBG MONITORING, ED    EKG  EKG Interpretation None       Radiology No results found.  Procedures Procedures (including critical care time)  Medications Ordered in ED Medications - No data to display   Initial Impression / Assessment and Plan / ED Course  I have reviewed the triage vital signs and the nursing  notes.  Pertinent labs & imaging results that were available during my care of the patient were reviewed by me and considered in my medical decision making (see chart for details).     Patient with back pain.  No neurological deficits and normal neuro exam, but patient does have subjective tingling in her extremities. Will check CBG.    Patient is ambulatory.  No loss of bowel or bladder control.  Doubt cauda equina.  Denies fever,  doubt epidural abscess or other lesion. Recommend back exercises, stretching, RICE, and will treat with Tylenol.  Encouraged the patient that there could be a need for additional workup and/or imaging such as MRI, if the symptoms do not resolve. Patient advised that if the back pain does not resolve, or radiates, this could progress to more serious conditions and is encouraged to follow-up with PCP or orthopedics within 2 weeks.     Final Clinical Impressions(s) / ED Diagnoses   Final diagnoses:  Acute  bilateral low back pain, with sciatica presence unspecified    New Prescriptions New Prescriptions   No medications on file     Roxy Horseman, Cordelia Poche 12/03/16 0438    Paula Libra, MD 12/03/16 [phone]

## 2016-12-03 NOTE — ED Triage Notes (Signed)
Pt states she has had lower back pain for 3 days  Pt states today she has felt sick, nausea, chills, and cold sweats  Pt states last night and today when she lays down she gets tingling and numbness in her arms and legs

## 2016-12-03 NOTE — ED Notes (Signed)
ED Provider at bedside. 

## 2017-01-22 ENCOUNTER — Other Ambulatory Visit (HOSPITAL_COMMUNITY)
Admission: RE | Admit: 2017-01-22 | Discharge: 2017-01-22 | Disposition: A | Discharge status: Home / Self Care | Payer: Medicaid Other | Source: Ambulatory Visit | Attending: Advanced Practice Midwife | Admitting: Advanced Practice Midwife

## 2017-01-22 ENCOUNTER — Encounter: Payer: Self-pay | Admitting: Advanced Practice Midwife

## 2017-01-22 ENCOUNTER — Ambulatory Visit (INDEPENDENT_AMBULATORY_CARE_PROVIDER_SITE_OTHER): Payer: Medicaid Other | Admitting: Advanced Practice Midwife

## 2017-01-22 VITALS — BP 111/69 | HR 76 | Wt 176.0 lb

## 2017-01-22 DIAGNOSIS — Z124 Encounter for screening for malignant neoplasm of cervix: Secondary | ICD-10-CM

## 2017-01-22 DIAGNOSIS — Z3480 Encounter for supervision of other normal pregnancy, unspecified trimester: Secondary | ICD-10-CM | POA: Insufficient documentation

## 2017-01-22 DIAGNOSIS — Z113 Encounter for screening for infections with a predominantly sexual mode of transmission: Secondary | ICD-10-CM | POA: Diagnosis not present

## 2017-01-22 DIAGNOSIS — Z3482 Encounter for supervision of other normal pregnancy, second trimester: Secondary | ICD-10-CM

## 2017-01-22 DIAGNOSIS — Z3A14 14 weeks gestation of pregnancy: Secondary | ICD-10-CM | POA: Insufficient documentation

## 2017-01-22 DIAGNOSIS — N926 Irregular menstruation, unspecified: Secondary | ICD-10-CM

## 2017-01-22 LAB — POCT URINALYSIS DIP (DEVICE)
Bilirubin Urine: NEGATIVE
Glucose, UA: NEGATIVE mg/dL
Hgb urine dipstick: NEGATIVE
Ketones, ur: NEGATIVE mg/dL
Leukocytes, UA: NEGATIVE
Nitrite: NEGATIVE
Protein, ur: NEGATIVE mg/dL
Specific Gravity, Urine: 1.01 (ref 1.005–1.030)
Urobilinogen, UA: 0.2 mg/dL (ref 0.0–1.0)
pH: 5.5 (ref 5.0–8.0)

## 2017-01-22 MED ORDER — CONCEPT OB 130-92.4-1 MG PO CAPS: 1.0000 | ORAL_CAPSULE | Freq: Every day | ORAL | 12 refills | Status: DC

## 2017-01-22 NOTE — Patient Instructions (Addendum)
To sign up for prenatal classed go to [email or web address]

## 2017-01-22 NOTE — Progress Notes (Signed)
Here for initial prenatal visit. Had + pregnancy test in ED 12/03/16. [facility] and had a positive pregnancy test and they did  [place]  [facility].  States periods usually regular and last about 5 days, had a period  In July lasted 1 day and in August 2 days. Given new patient education booklets. Declines flu shot. Would like genetic testing. Enrolled in babyscripps app and instructed her to let us know if her due date is changed after us so we can get babyscripts updated. PMH form completed.

## 2017-01-23 LAB — OBSTETRIC PANEL, INCLUDING HIV
Antibody Screen: NEGATIVE
Basophils Absolute: 0 10*3/uL (ref 0.0–0.2)
Basos: 0 %
EOS (ABSOLUTE): 0.1 10*3/uL (ref 0.0–0.4)
Eos: 1 %
HIV Screen 4th Generation wRfx: NONREACTIVE
Hematocrit: 37.5 % (ref 34.0–46.6)
Hemoglobin: 12.3 g/dL (ref 11.1–15.9)
Hepatitis B Surface Ag: NEGATIVE
Immature Grans (Abs): 0 10*3/uL (ref 0.0–0.1)
Immature Granulocytes: 0 %
Lymphocytes Absolute: 2.4 10*3/uL (ref 0.7–3.1)
Lymphs: 23 %
MCH: 30.2 pg (ref 26.6–33.0)
MCHC: 32.8 g/dL (ref 31.5–35.7)
MCV: 92 fL (ref 79–97)
Monocytes Absolute: 0.5 10*3/uL (ref 0.1–0.9)
Monocytes: 5 %
Neutrophils Absolute: 7.2 10*3/uL — ABNORMAL HIGH (ref 1.4–7.0)
Neutrophils: 71 %
Platelets: 306 10*3/uL (ref 150–379)
RBC: 4.07 x10E6/uL (ref 3.77–5.28)
RDW: 13.3 % (ref 12.3–15.4)
RPR Ser Ql: NONREACTIVE
Rh Factor: POSITIVE
Rubella Antibodies, IGG: 1.74 index (ref >0.99)
WBC: 10.1 10*3/uL (ref 3.4–10.8)

## 2017-01-24 LAB — URINE CULTURE, OB REFLEX

## 2017-01-24 LAB — CYTOLOGY - PAP
Chlamydia: NEGATIVE
Diagnosis: NEGATIVE
Neisseria Gonorrhea: NEGATIVE

## 2017-01-24 LAB — CULTURE, OB URINE

## 2017-01-27 ENCOUNTER — Encounter (HOSPITAL_COMMUNITY): Payer: Self-pay | Admitting: *Deleted

## 2017-01-27 ENCOUNTER — Inpatient Hospital Stay (HOSPITAL_COMMUNITY)
Admission: AD | Admit: 2017-01-27 | Discharge: 2017-01-27 | Disposition: A | Discharge status: Home / Self Care | Payer: Medicaid Other | Source: Ambulatory Visit | Attending: Obstetrics & Gynecology | Admitting: Obstetrics & Gynecology

## 2017-01-27 ENCOUNTER — Inpatient Hospital Stay (EMERGENCY_DEPARTMENT_HOSPITAL)
Admission: AD | Admit: 2017-01-27 | Discharge: 2017-01-27 | Disposition: A | Discharge status: Home / Self Care | Payer: Medicaid Other | Source: Ambulatory Visit | Attending: Family Medicine | Admitting: Family Medicine

## 2017-01-27 DIAGNOSIS — Z87891 Personal history of nicotine dependence: Secondary | ICD-10-CM | POA: Insufficient documentation

## 2017-01-27 DIAGNOSIS — O26892 Other specified pregnancy related conditions, second trimester: Secondary | ICD-10-CM | POA: Insufficient documentation

## 2017-01-27 DIAGNOSIS — O34512 Maternal care for incarceration of gravid uterus, second trimester: Secondary | ICD-10-CM | POA: Diagnosis present

## 2017-01-27 DIAGNOSIS — O9989 Other specified diseases and conditions complicating pregnancy, childbirth and the puerperium: Secondary | ICD-10-CM

## 2017-01-27 DIAGNOSIS — Z3A14 14 weeks gestation of pregnancy: Secondary | ICD-10-CM | POA: Insufficient documentation

## 2017-01-27 DIAGNOSIS — O34513 Maternal care for incarceration of gravid uterus, third trimester: Secondary | ICD-10-CM | POA: Insufficient documentation

## 2017-01-27 DIAGNOSIS — R339 Retention of urine, unspecified: Secondary | ICD-10-CM | POA: Insufficient documentation

## 2017-01-27 DIAGNOSIS — R338 Other retention of urine: Secondary | ICD-10-CM | POA: Diagnosis present

## 2017-01-27 DIAGNOSIS — Z3480 Encounter for supervision of other normal pregnancy, unspecified trimester: Secondary | ICD-10-CM

## 2017-01-27 HISTORY — DX: Maternal care for incarceration of gravid uterus, second trimester: O34.512

## 2017-01-27 LAB — URINALYSIS, ROUTINE W REFLEX MICROSCOPIC
Bilirubin Urine: NEGATIVE
Glucose, UA: NEGATIVE mg/dL
Hgb urine dipstick: NEGATIVE
Ketones, ur: NEGATIVE mg/dL
Leukocytes, UA: NEGATIVE
Nitrite: NEGATIVE
Protein, ur: NEGATIVE mg/dL
Specific Gravity, Urine: 1.01 (ref 1.005–1.030)
pH: 6 (ref 5.0–8.0)

## 2017-01-27 NOTE — Discharge Instructions (Signed)
Acute Urinary Retention, Female  Acute urinary retention is the temporary inability to urinate. This is an uncommon problem in women. It can be caused by:   Infection.   A side effect of a medicine.   A problem in a nearby organ that presses or squeezes on the bladder or the urethra (the tube that drains the bladder).   Psychological problems.   Surgery on your bladder, urethra, or pelvic organs that causes obstruction to the outflow of urine from your bladder.    Follow these instructions at home:  If you are sent home with a Foley catheter and a drainage system, you will need to discuss the best course of action with your health care provider. While the catheter is in, maintain a good intake of fluids. Keep the drainage bag emptied and lower than your catheter. This is so that contaminated urine will not flow back into your bladder, which could lead to a urinary tract infection.  There are two main types of drainage bags. One is a large bag that usually is used at night. It has a good capacity that will allow you to sleep through the night without having to empty it. The second type is called a leg bag. It has a smaller capacity so it needs to be emptied more frequently. However, the main advantage is that it can be attached by a leg strap and goes underneath your clothing, allowing you the freedom to move about or leave your home.  Only take over-the-counter or prescription medicines for pain, discomfort, or fever as directed by your health care provider.  Contact a health care provider if:   You develop a low-grade fever.   You experience spasms or leakage of urine with the spasms.  Get help right away if:   You develop chills or fever.   Your catheter stops draining urine.   Your catheter falls out.   You start to develop increased bleeding that does not respond to rest and increased fluid intake.  This information is not intended to replace advice given to you by your health care provider. Make sure  you discuss any questions you have with your health care provider.  Document Released: 03/03/2006 Document Revised: 08/16/2015 Document Reviewed: 08/13/2012  [employer] Interactive Patient Education  2017 [employer]

## 2017-01-27 NOTE — MAU Provider Note (Signed)
Chief Complaint:  Urinary Retention   First Provider Initiated Contact with Patient 01/27/17 0132     HPI: Robin Webster is a 26 y.o. G2P1001 at 75w3dwho presents to maternity admissions reporting unable to empty bladder since this morning.  Is able to void some but not empty bladder. Has never had this happen before. She denies LOF, vaginal bleeding, vaginal itching/burning, h/a, dizziness, n/v, diarrhea, constipation or fever/chills.    Other  This is a new problem. The current episode started today. The problem has been unchanged. Associated symptoms include abdominal pain and urinary symptoms. Pertinent negatives include no chills, fever, myalgias or nausea. Nothing aggravates the symptoms. She has tried nothing for the symptoms.    RN Note: Pt here with c/o problems urinating starting this morning. Feels like "I can't empty all the way" and now starting to have abdominal cramping.     Past Medical History: Past Medical History:  Diagnosis Date  . Vaginal Pap smear, abnormal     Past obstetric history: OB History  Gravida Para Term Preterm AB Living  2 1 1     1   SAB TAB Ectopic Multiple Live Births          1    # Outcome Date GA Lbr Len/2nd Weight Sex Delivery Anes PTL Lv  2 Current           1 Term 2007 [redacted]w[redacted]d  7 lb 3 oz (3.26 kg) F Vag-Spont None  LIV     Birth Comments: no complications      Past Surgical History: Past Surgical History:  Procedure Laterality Date  . NO PAST SURGERIES      Family History: Family History  Problem Relation Age of Onset  . Hypertension Father     Social History: Social History   Tobacco Use  . Smoking status: Former Smoker    Packs/day: 0.25    Types: Cigarettes    Last attempt to quit: 01/26/2017  . Smokeless tobacco: Never Used  . Tobacco comment: is trying to quit  Substance Use Topics  . Alcohol use: Yes    Comment: occasionally, none since pregnant  . Drug use: Yes    Types: Marijuana    Comment: last used 2013      Allergies: No Known Allergies  Meds:  Medications Prior to Admission  Medication Sig Dispense Refill Last Dose  . acetaminophen (TYLENOL) 500 MG tablet Take 1,000 mg by mouth every 6 (six) hours as needed for mild pain.   Taking  . fluticasone (FLONASE) 50 MCG/ACT nasal spray Place 1 spray into both nostrils daily. (Patient not taking: Reported on 12/03/2016) 16 g 0 Not Taking at Unknown time  . methocarbamol (ROBAXIN) 500 MG tablet Take 1 tablet (500 mg total) by mouth 2 (two) times daily. (Patient not taking: Reported on 01/18/2015) 20 tablet 0 Not Taking at Unknown time  . metroNIDAZOLE (FLAGYL) 500 MG tablet Take 1 tablet (500 mg total) by mouth 2 (two) times daily. (Patient not taking: Reported on 12/03/2016) 14 tablet 0 Not Taking at Unknown time  . Misc. Throat Products [facility] SALT WATER GARGLE) LIQD Swish and gargle, spit. (Patient not taking: Reported on 01/18/2015) 354 mL 0 Not Taking at Unknown time  . naproxen (NAPROSYN) 500 MG tablet Take 1 tablet (500 mg total) by mouth 2 (two) times daily. (Patient not taking: Reported on 01/18/2015) 30 tablet 0 Not Taking at Unknown time  . oxyCODONE-acetaminophen (PERCOCET/ROXICET) 5-325 MG per tablet Take 2 tablets by mouth  every 4 (four) hours as needed for severe pain. (Patient not taking: Reported on 01/18/2015) 6 tablet 0 Not Taking at Unknown time  . Prenat w/o A Vit-FeFum-FePo-FA (CONCEPT OB) 130-92.4-1 MG CAPS Take 1 tablet daily by mouth. 30 capsule 12   . Prenatal Vit-Fe Fumarate-FA (PRENATAL MULTIVITAMIN) TABS tablet Take 1 tablet by mouth daily at 12 noon.   Taking    I have reviewed patient's Past Medical Hx, Surgical Hx, Family Hx, Social Hx, medications and allergies.   ROS:  Review of Systems  Constitutional: Negative for chills and fever.  Gastrointestinal: Positive for abdominal pain. Negative for nausea.  Genitourinary: Positive for decreased urine volume, difficulty urinating and pelvic pain. Negative for dysuria,  flank pain and frequency.  Musculoskeletal: Negative for myalgias.   Other systems negative  Physical Exam   Patient Vitals for the past 24 hrs:  BP Temp Temp src Pulse Resp SpO2 Height Weight  01/27/17 0053 103/66 97.9 F (36.6 C) Oral 72 18 100 % 5\' 2"  (1.575 m) 176 lb (79.8 kg)   Constitutional: Well-developed, well-nourished female in no acute distress.  Cardiovascular: normal rate and rhythm Respiratory: normal effort, clear to auscultation bilaterally GI: Abd soft, non-tender, gravid appropriate for gestational age.   No rebound or guarding. MS: Extremities nontender, no edema, normal ROM Neurologic: Alert and oriented x 4.  GU: Neg CVAT.  PELVIC EXAM:   Cervix is very anterior and partially elongated.  There is fullness in posterior culdesac, consistent with incarcerated uterus.   Attempted to move fundus anteriorly but was unable to move it.   FHT:  152   Labs: Results for orders placed or performed during the hospital encounter of 01/27/17 (from the past 24 hour(s))  Urinalysis, Routine w reflex microscopic     Status: Abnormal   Collection Time: 01/27/17 12:46 AM  Result Value Ref Range   Color, Urine YELLOW YELLOW   APPearance HAZY (A) CLEAR   Specific Gravity, Urine 1.010 1.005 - 1.030   pH 6.0 5.0 - 8.0   Glucose, UA NEGATIVE NEGATIVE mg/dL   Hgb urine dipstick NEGATIVE NEGATIVE   Bilirubin Urine NEGATIVE NEGATIVE   Ketones, ur NEGATIVE NEGATIVE mg/dL   Protein, ur NEGATIVE NEGATIVE mg/dL   Nitrite NEGATIVE NEGATIVE   Leukocytes, UA NEGATIVE NEGATIVE   O/Positive/-- (11/07 1152)  Imaging:  No results found.  MAU Course/MDM: I have ordered labs and reviewed results.  Bladder catheterized by RN and 850ml obtained.  Consult Dr Despina HiddenEure with presentation, exam findings and test results. He recommends placing foley or teaching her straight cath and waiting for uterus to come back forward.    She declined both foley and intermittent caths  I discussed nature of   Incarcerated uterus and likely return of urinary retention.  Discussed frequent voiding attempts every 2 hours.  If bladder re-distends needs to return for catheterization Warned that bladder can rupture. She wants to go home and see what happens  We did discuss that knee chest positioning may help move uterus.  Will get her into clinic on Wed for recheck, but I suspect she will return to MAU prior to that.   If she returns, we will try to insist again on foley placement     Assessment: Single IUP at 6258w3d Incarcerated uterus Acute urinary retention  Plan: Discharge home Urinary retention  precautions  Frequent voids Return if unable to void Knee chest position whenever she can. Follow up in Office for prenatal visits and recheck this week  Encouraged to return here or to other Urgent Care/ED if she develops worsening of symptoms, increase in pain, fever, or other concerning symptoms.   Pt stable at time of discharge.  Wynelle BourgeoisMarie Sarabella Caprio CNM, MSN Certified Nurse-Midwife 01/27/2017 1:33 AM

## 2017-01-27 NOTE — Progress Notes (Signed)
  Subjective:    Robin Webster is being seen today for her first obstetrical visit. She is at 4257w3d gestation by irreg, but certain LMP. Her obstetrical history is significant for nothing. Relationship with FOB: involved Patient does intend to breast feed. Pregnancy history fully reviewed.  Patient reports no complaints.  Review of Systems:   Review of Systems  Constitutional: Negative for chills and fever.  Gastrointestinal: Negative for abdominal pain.  Genitourinary: Negative for dysuria, vaginal bleeding and vaginal discharge.    Objective:     BP 111/69   Pulse 76   Wt 176 lb (79.8 kg)   LMP 10/18/2016   BMI 29.29 kg/m  Physical Exam  Nursing note and vitals reviewed. Constitutional: She is oriented to person, place, and time. She appears well-developed and well-nourished. No distress.  Eyes: No scleral icterus.  Neck: No thyromegaly present.  Cardiovascular: Normal rate, regular rhythm and normal heart sounds.  Respiratory: Effort normal and breath sounds normal.  GI: Soft. There is no tenderness.  Fundal Height 3/SP  Genitourinary: Vagina normal. There is no lesion on the right labia. There is no lesion on the left labia. Uterus is enlarged. Uterus is not fixed and not tender. Cervix exhibits no motion tenderness, no discharge and no friability. Right adnexum displays no mass, no tenderness and no fullness. Left adnexum displays no mass and no tenderness. No bleeding in the vagina. No vaginal discharge found.  Genitourinary Comments: Retroverted uterus.  Musculoskeletal: She exhibits no edema or tenderness.  Lymphadenopathy:       Right: No inguinal adenopathy present.       Left: No inguinal adenopathy present.  Neurological: She is alert and oriented to person, place, and time. She has normal reflexes.  Skin: Skin is warm and dry.  Psychiatric: She has a normal mood and affect.    Maternal Exam:  Introitus: Vagina is negative for discharge.       Assessment:     Pregnancy: G2P1001 Patient Active Problem List   Diagnosis Date Noted  . 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 Acute urinary retention 01/27/2017  . Supervision of other normal pregnancy, antepartum 01/22/2017       Plan:  1. Supervision of other normal pregnancy, antepartum  - Obstetric Panel, Including HIV - Cytology - PAP - Culture, OB Urine - US OB Transvaginal; Future - Enroll Patient in Babyscripts  2. Irregular periods  - US OB Comp Less 14 Wks; Future - US OB Transvaginal; Future    Initial labs drawn. Prenatal vitamins. Problem list reviewed and updated. AFP3 discussed: requested. Role of ultrasound in pregnancy discussed; fetal survey: requested. Amniocentesis discussed: not indicated. Follow up in 4 weeks.   Dorathy KinsmanVirginia Tanasia Budzinski 01/27/2017

## 2017-01-27 NOTE — MAU Note (Signed)
Pt presents to MAU for catheter placement. Unable to void, was evaluated earlier this morning in MAU

## 2017-01-27 NOTE — MAU Note (Signed)
Pt here with c/o problems urinating starting this morning. Feels like "I can't empty all the way" and now starting to have abdominal cramping.

## 2017-01-27 NOTE — MAU Note (Signed)
Foley catheter inserted with 1000cc of clear yellow urine return, changed to leg bag as ordered

## 2017-01-27 NOTE — MAU Provider Note (Signed)
  History     CSN: [id number]  Arrival date and time: 01/27/17 1526   None     Chief Complaint  Patient presents with  . difficulty voiding   HPI 26 yo G2P1 at 6814 w returning today for inability to void. Patient was seen earlier this morning and diagnosed with urinary retention due to an incarcerated uterus. Patient declined leg bag this morning and returns today with abdominal pain and inability to void. Patient denies any vaginal bleeding and is without any other complaints   Past Medical History:  Diagnosis Date  . Vaginal Pap smear, abnormal     Past Surgical History:  Procedure Laterality Date  . NO PAST SURGERIES      Family History  Problem Relation Age of Onset  . Hypertension Father     Social History   Tobacco Use  . Smoking status: Former Smoker    Packs/day: 0.25    Types: Cigarettes    Last attempt to quit: 01/26/2017  . Smokeless tobacco: Never Used  . Tobacco comment: is trying to quit  Substance Use Topics  . Alcohol use: Yes    Comment: occasionally, none since pregnant  . Drug use: Yes    Types: Marijuana    Comment: last used 2013     Allergies: No Known Allergies  Medications Prior to Admission  Medication Sig Dispense Refill Last Dose  . acetaminophen (TYLENOL) 500 MG tablet Take 1,000 mg by mouth every 6 (six) hours as needed for mild pain.   Taking  . fluticasone (FLONASE) 50 MCG/ACT nasal spray Place 1 spray into both nostrils daily. (Patient not taking: Reported on 12/03/2016) 16 g 0 Not Taking at Unknown time  . metroNIDAZOLE (FLAGYL) 500 MG tablet Take 1 tablet (500 mg total) by mouth 2 (two) times daily. (Patient not taking: Reported on 12/03/2016) 14 tablet 0 Not Taking at Unknown time  . Misc. Throat Products [facility](CHLORASEPTIC SALT WATER GARGLE) LIQD Swish and gargle, spit. (Patient not taking: Reported on 01/18/2015) 354 mL 0 Not Taking at Unknown time  . Prenat w/o A Vit-FeFum-FePo-FA (CONCEPT OB) 130-92.4-1 MG CAPS Take 1 tablet daily  by mouth. 30 capsule 12   . Prenatal Vit-Fe Fumarate-FA (PRENATAL MULTIVITAMIN) TABS tablet Take 1 tablet by mouth daily at 12 noon.   Taking    Review of Systems  See pertinent in HPI Physical Exam   Last menstrual period 10/18/2016.  Physical Exam GENERAL: Well-developed, well-nourished female in no acute distress.  PELVIC: Deferred as it was done earlier this morning EXTREMITIES: No cyanosis, clubbing, or edema, 2+ distal pulses.  MAU Course  Procedures  MDM Foley catheter placed- bag filled with 1000 cc urine  Assessment and Plan  26 yo G2P1 at 478w3d with urinary retention - Patient felt relief of symptoms s/p foley catheter placement - Patient agreed to leg bag placement - Follow up in office next Monday for voiding trial - Precautions reviewed - RTC prn  Somya Jauregui 01/27/2017, 6:06 PM

## 2017-01-28 ENCOUNTER — Other Ambulatory Visit: Payer: Self-pay | Admitting: Advanced Practice Midwife

## 2017-01-28 ENCOUNTER — Ambulatory Visit (HOSPITAL_COMMUNITY)
Admission: RE | Admit: 2017-01-28 | Discharge: 2017-01-28 | Disposition: A | Discharge status: Home / Self Care | Payer: Medicaid Other | Source: Ambulatory Visit | Attending: Advanced Practice Midwife | Admitting: Advanced Practice Midwife

## 2017-01-28 DIAGNOSIS — Z3480 Encounter for supervision of other normal pregnancy, unspecified trimester: Secondary | ICD-10-CM

## 2017-01-28 DIAGNOSIS — N926 Irregular menstruation, unspecified: Secondary | ICD-10-CM

## 2017-01-28 DIAGNOSIS — Z3687 Encounter for antenatal screening for uncertain dates: Secondary | ICD-10-CM | POA: Insufficient documentation

## 2017-01-29 ENCOUNTER — Ambulatory Visit (INDEPENDENT_AMBULATORY_CARE_PROVIDER_SITE_OTHER): Payer: Medicaid Other | Admitting: Advanced Practice Midwife

## 2017-01-29 DIAGNOSIS — Z3480 Encounter for supervision of other normal pregnancy, unspecified trimester: Secondary | ICD-10-CM

## 2017-01-29 DIAGNOSIS — O34512 Maternal care for incarceration of gravid uterus, second trimester: Secondary | ICD-10-CM

## 2017-01-29 NOTE — Progress Notes (Signed)
Per Dorathy KinsmanVirginia Smith CNM, I removed patient's foley cath. Discussed with patient the need to remain in lobby and drink lots of fluids. Hat given to patient to void in, told patient let someone know when she voids so we can assess volume. Understanding voiced.

## 2017-01-29 NOTE — Progress Notes (Signed)
   PRENATAL VISIT NOTE  Subjective:  Wendie AgresteMoana Barsanti is a 26 y.o. G2P1001 at 3813w5d being seen today for follow-up after MAU visit for acute urinary retention and Dx incarcerated uterus. Indwelling catheter was placed and pt instructed in pelvic tilt maneuvers to aid resolution of incarcerated uterus.  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  Patient reports no complaints.  Contractions: Not present. Vag. Bleeding: None.  Movement: Absent. Denies leaking of fluid.   The following portions of the patient's history were reviewed and updated as appropriate: allergies, current medications, past family history, past medical history, past social history, past surgical history and problem list. Problem list updated.  Objective:   Vitals:   01/29/17 1102  BP: 118/69  Pulse: 76  Weight: 177 lb 14.4 oz (80.7 kg)    Fetal Status: Fetal Heart Rate (bpm): 161   Movement: Absent (Hasn't experienced quickening)    General:  Alert, oriented and cooperative. Patient is in no acute distress.  Skin: Skin is warm and dry. No rash noted.   Cardiovascular: Normal heart rate noted  Respiratory: Normal respiratory effort, no problems with respiration noted  Abdomen: Soft, gravid, appropriate for gestational age.  Pain/Pressure: Absent     Pelvic: Cervical exam deferred        Extremities: Normal range of motion.  Edema: None  Mental Status:  Normal mood and affect. Normal behavior. Normal judgment and thought content.   Catheter removed. Able to void QS  Assessment and Plan:  Pregnancy: G2P1001 at 7213w5d  Incarcerated uterus. Now able to void spontaneously C/W resolution. Will CTO closely. Pt instructed to call office and go to MAU if urinary retention recurs. May need further intervention to correct incarceration  of uterus.   Preterm labor symptoms and general obstetric precautions including but not limited to vaginal bleeding, contractions, leaking of fluid and fetal movement were reviewed in detail with the patient. Please refer to After Visit Summary for other counseling recommendations.  F/U AS for sooner PRN  Dorathy KinsmanVirginia Carollyn Etcheverry, CNM

## 2017-02-03 NOTE — Patient Instructions (Signed)
Second Trimester of Pregnancy The second trimester is from week 13 through week 28, month 4 through 6. This is often the time in pregnancy that you feel your best. Often times, morning sickness has lessened or quit. You may have more energy, and you may get hungry more often. Your unborn baby (fetus) is growing rapidly. At the end of the sixth month, he or she is about 9 inches long and weighs about 1 pounds. You will likely feel the baby move (quickening) between 18 and 20 weeks of pregnancy. Follow these instructions at home:  Avoid all smoking, herbs, and alcohol. Avoid drugs not approved by your doctor.  Do not use any tobacco products, including cigarettes, chewing tobacco, and electronic cigarettes. If you need help quitting, ask your doctor. You may get counseling or other support to help you quit.  Only take medicine as told by your doctor. Some medicines are safe and some are not during pregnancy.  Exercise only as told by your doctor. Stop exercising if you start having cramps.  Eat regular, healthy meals.  Wear a good support bra if your breasts are tender.  Do not use hot tubs, steam rooms, or saunas.  Wear your seat belt when driving.  Avoid raw meat, uncooked cheese, and liter boxes and soil used by cats.  Take your prenatal vitamins.  Take 1500-2000 milligrams of calcium daily starting at the 20th week of pregnancy until you deliver your baby.  Try taking medicine that helps you poop (stool softener) as needed, and if your doctor approves. Eat more fiber by eating fresh fruit, vegetables, and whole grains. Drink enough fluids to keep your pee (urine) clear or pale yellow.  Take warm water baths (sitz baths) to soothe pain or discomfort caused by hemorrhoids. Use hemorrhoid cream if your doctor approves.  If you have puffy, bulging veins (varicose veins), wear support hose. Raise (elevate) your feet for 15 minutes, 3-4 times a day. Limit salt in your diet.  Avoid heavy  lifting, wear low heals, and sit up straight.  Rest with your legs raised if you have leg cramps or low back pain.  Visit your dentist if you have not gone during your pregnancy. Use a soft toothbrush to brush your teeth. Be gentle when you floss.  You can have sex (intercourse) unless your doctor tells you not to.  Go to your doctor visits. Get help if:  You feel dizzy.  You have mild cramps or pressure in your lower belly (abdomen).  You have a nagging pain in your belly area.  You continue to feel sick to your stomach (nauseous), throw up (vomit), or have watery poop (diarrhea).  You have bad smelling fluid coming from your vagina.  You have pain with peeing (urination). Get help right away if:  You have a fever.  You are leaking fluid from your vagina.  You have spotting or bleeding from your vagina.  You have severe belly cramping or pain.  You lose or gain weight rapidly.  You have trouble catching your breath and have chest pain.  You notice sudden or extreme puffiness (swelling) of your face, hands, ankles, feet, or legs.  You have not felt the baby move in over an hour.  You have severe headaches that do not go away with medicine.  You have vision changes. This information is not intended to replace advice given to you by your health care provider. Make sure you discuss any questions you have with your health care   provider. Document Released: 05/29/2009 Document Revised: 08/10/2015 Document Reviewed: 05/05/2012 Elsevier Interactive Patient Education  2017 [employer]vier Inc.  

## 2017-02-13 ENCOUNTER — Ambulatory Visit (INDEPENDENT_AMBULATORY_CARE_PROVIDER_SITE_OTHER): Payer: Medicaid Other | Admitting: Student

## 2017-02-13 VITALS — BP 119/71 | HR 81 | Wt 179.1 lb

## 2017-02-13 DIAGNOSIS — R338 Other retention of urine: Secondary | ICD-10-CM

## 2017-02-13 DIAGNOSIS — Z3480 Encounter for supervision of other normal pregnancy, unspecified trimester: Secondary | ICD-10-CM

## 2017-02-13 DIAGNOSIS — O34512 Maternal care for incarceration of gravid uterus, second trimester: Secondary | ICD-10-CM

## 2017-02-13 DIAGNOSIS — Z3482 Encounter for supervision of other normal pregnancy, second trimester: Secondary | ICD-10-CM

## 2017-02-13 NOTE — Progress Notes (Signed)
   PRENATAL VISIT NOTE  Subjective:  Robin Webster is a 26 y.o. G2P1001 at 2628w2d being seen today for ongoing prenatal care.  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  Patient reports no complaints. Patient recently diagnosed with incarcerated uterus; however has had no trouble voiding after pelvic exercises. . Vag. Bleeding: None.  Movement: Present. Denies leaking of fluid.   The following portions of the patient's history were reviewed and updated as appropriate: allergies, current medications, past family history, past medical history, past social history, past surgical history and problem list. Problem list updated.  Objective:   Vitals:   02/13/17 1633  BP: 119/71  Pulse: 81  Weight: 179 lb 1.6 oz (81.2 kg)    Fetal Status: Fetal Heart Rate (bpm): 158   Movement: Present     General:  Alert, oriented and cooperative. Patient is in no acute distress.  Skin: Skin is warm and dry. No rash noted.   Cardiovascular: Normal heart rate noted  Respiratory: Normal respiratory effort, no problems with respiration noted  Abdomen: Soft, gravid, appropriate for gestational age.  Pain/Pressure: Absent     Pelvic: Cervical exam deferred        Extremities: Normal range of motion.  Edema: None  Mental Status:  Normal mood and affect. Normal behavior. Normal judgment and thought content.   Assessment and Plan:  Pregnancy: G2P1001 at 3928w2d  1. Supervision of other normal pregnancy, antepartum -Quad screen tomorrow - US MFM OB DETAIL +14 WK; Future  2. 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   Preterm labor symptoms and general obstetric precautions including but not limited to  vaginal bleeding, contractions, leaking of fluid and fetal movement were reviewed in detail with the patient. Please refer to After Visit Summary for other counseling recommendations.  Return in about 4 weeks (around 03/13/2017) for OB visit.   Marylene LandKathryn Lorraine Lasasha Brophy, CNM

## 2017-02-13 NOTE — Patient Instructions (Signed)

## 2017-02-14 ENCOUNTER — Other Ambulatory Visit: Payer: Medicaid Other

## 2017-02-18 LAB — AFP TETRA
DIA Mom Value: 1.28
DIA Value (EIA): 195.92 pg/mL
DSR (By Age)    1 IN: 757
DSR (Second Trimester) 1 IN: 7901
Gestational Age: 16.3 WEEKS
MSAFP Mom: 1.38
MSAFP: 41 ng/mL
MSHCG Mom: 0.53
MSHCG: 17117 m[IU]/mL
Maternal Age At EDD: 29.3 yr
Osb Risk: 3810
T18 (By Age): 1:2949 {titer}
Test Results:: NEGATIVE
Weight: 179 [lb_av]
uE3 Mom: 0.86
uE3 Value: 0.7 ng/mL

## 2017-02-19 ENCOUNTER — Encounter: Payer: Medicaid Other | Admitting: Advanced Practice Midwife

## 2017-02-25 ENCOUNTER — Encounter (HOSPITAL_COMMUNITY): Payer: Self-pay | Admitting: Obstetrics and Gynecology

## 2017-03-04 ENCOUNTER — Other Ambulatory Visit: Payer: Self-pay | Admitting: Obstetrics and Gynecology

## 2017-03-04 ENCOUNTER — Ambulatory Visit (HOSPITAL_COMMUNITY)
Admission: RE | Admit: 2017-03-04 | Discharge: 2017-03-04 | Disposition: A | Discharge status: Home / Self Care | Payer: Medicaid Other | Source: Ambulatory Visit | Attending: Obstetrics and Gynecology | Admitting: Obstetrics and Gynecology

## 2017-03-04 ENCOUNTER — Encounter (HOSPITAL_COMMUNITY): Payer: Self-pay

## 2017-03-04 DIAGNOSIS — Z3480 Encounter for supervision of other normal pregnancy, unspecified trimester: Secondary | ICD-10-CM

## 2017-03-04 DIAGNOSIS — O321XX0 Maternal care for breech presentation, not applicable or unspecified: Secondary | ICD-10-CM | POA: Insufficient documentation

## 2017-03-04 DIAGNOSIS — Z3A19 19 weeks gestation of pregnancy: Secondary | ICD-10-CM | POA: Insufficient documentation

## 2017-03-04 DIAGNOSIS — O34592 Maternal care for other abnormalities of gravid uterus, second trimester: Secondary | ICD-10-CM | POA: Insufficient documentation

## 2017-03-04 DIAGNOSIS — Z3689 Encounter for other specified antenatal screening: Secondary | ICD-10-CM | POA: Insufficient documentation

## 2017-03-04 DIAGNOSIS — Z369 Encounter for antenatal screening, unspecified: Secondary | ICD-10-CM

## 2017-03-04 DIAGNOSIS — O34512 Maternal care for incarceration of gravid uterus, second trimester: Secondary | ICD-10-CM

## 2017-03-13 ENCOUNTER — Ambulatory Visit (INDEPENDENT_AMBULATORY_CARE_PROVIDER_SITE_OTHER): Payer: Medicaid Other | Admitting: Obstetrics and Gynecology

## 2017-03-13 ENCOUNTER — Encounter: Payer: Self-pay | Admitting: Obstetrics and Gynecology

## 2017-03-13 VITALS — BP 126/61 | HR 86 | Wt 179.7 lb

## 2017-03-13 DIAGNOSIS — Z3480 Encounter for supervision of other normal pregnancy, unspecified trimester: Secondary | ICD-10-CM

## 2017-03-13 DIAGNOSIS — Z3482 Encounter for supervision of other normal pregnancy, second trimester: Secondary | ICD-10-CM

## 2017-03-13 DIAGNOSIS — O34512 Maternal care for incarceration of gravid uterus, second trimester: Secondary | ICD-10-CM

## 2017-03-13 NOTE — Progress Notes (Signed)
Subjective:  Robin Webster is a 26 y.o. G2P1001 at 440w2d being seen today for ongoing prenatal care.  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  Patient reports no complaints.  Contractions: Not present. Vag. Bleeding: None.  Movement: Present. Denies leaking of fluid.   The following portions of the patient's history were reviewed and updated as appropriate: allergies, current medications, past family history, past medical history, past social history, past surgical history and problem list. Problem list updated.  Objective:   Vitals:   03/13/17 0817  BP: 126/61  Pulse: 86  Weight: 81.5 kg (179 lb 11.2 oz)    Fetal Status: Fetal Heart Rate (bpm): 155   Movement: Present     General:  Alert, oriented and cooperative. Patient is in no acute distress.  Skin: Skin is warm and dry. No rash noted.   Cardiovascular: Normal heart rate noted  Respiratory: Normal respiratory effort, no problems with respiration noted  Abdomen: Soft, gravid, appropriate for gestational age. Pain/Pressure: Absent     Pelvic:  Cervical exam deferred        Extremities: Normal range of motion.  Edema: None  Mental Status: Normal mood and affect. Normal behavior. Normal judgment and thought content.   Urinalysis:      Assessment and Plan:  Pregnancy: G2P1001 at 5240w2d  1. Supervision of other normal pregnancy, antepartum Stable  2. 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 Please refer to After Visit Summary for other counseling recommendations.  Return in about 4 weeks (around 04/10/2017) for OB visit.   Hermina StaggersErvin,  Lus Kriegel L, MD

## 2017-03-13 NOTE — Patient Instructions (Signed)

## 2017-03-18 NOTE — L&D Delivery Note (Signed)
Patient is 27 y.o. [id number] [redacted]w[redacted]d admitted for SROM with onset of labor. S/p augmentation of second stage by Pitocin.    Delivery Note At 8:00 AM a viable female was delivered via Vaginal, Spontaneous (Presentation: OA ).  APGAR: 8, 8; weight 7 lb 10.2 oz (3465 g).   Placenta status: Intact.  Cord: 3V with the following complications: Nuchal x1.  Cord pH: N/A  Anesthesia: Epidural   Episiotomy: None Lacerations: 1st degree;Periurethral Suture Repair: 3.0 vicryl Est. Blood Loss (mL): 150  Mom to postpartum.  Baby to Couplet care / Skin to Skin.  Caryl Ada, DO 07/24/2017, 8:30 AM OB Fellow Center for [facility], [facility]

## 2017-04-10 ENCOUNTER — Ambulatory Visit (INDEPENDENT_AMBULATORY_CARE_PROVIDER_SITE_OTHER): Payer: Medicaid Other | Admitting: Obstetrics and Gynecology

## 2017-04-10 ENCOUNTER — Encounter: Payer: Self-pay | Admitting: Obstetrics and Gynecology

## 2017-04-10 VITALS — BP 131/64 | HR 109 | Wt 182.3 lb

## 2017-04-10 DIAGNOSIS — Z3480 Encounter for supervision of other normal pregnancy, unspecified trimester: Secondary | ICD-10-CM

## 2017-04-10 DIAGNOSIS — O34512 Maternal care for incarceration of gravid uterus, second trimester: Secondary | ICD-10-CM

## 2017-04-10 NOTE — Progress Notes (Signed)
Subjective:  Robin Webster is a 27 y.o. G2P1001 at 5647w2d being seen today for ongoing prenatal care.  She is currently monitored for the following issues for this high-risk pregnancy and has Supervision of other normal pregnancy, antepartum and Incarcerated gravid uterus in second trimester on their problem list.  Patient reports no complaints.  Contractions: Not present. Vag. Bleeding: None.  Movement: Present. Denies leaking of fluid.   The following portions of the patient's history were reviewed and updated as appropriate: allergies, current medications, past family history, past medical history, past social history, past surgical history and problem list. Problem list updated.  Objective:   Vitals:   04/10/17 0821  BP: 131/64  Pulse: (!) 109  Weight: 182 lb 4.8 oz (82.7 kg)    Fetal Status: Fetal Heart Rate (bpm): 164   Movement: Present     General:  Alert, oriented and cooperative. Patient is in no acute distress.  Skin: Skin is warm and dry. No rash noted.   Cardiovascular: Normal heart rate noted  Respiratory: Normal respiratory effort, no problems with respiration noted  Abdomen: Soft, gravid, appropriate for gestational age. Pain/Pressure: Present     Pelvic:  Cervical exam deferred        Extremities: Normal range of motion.  Edema: None  Mental Status: Normal mood and affect. Normal behavior. Normal judgment and thought content.   Urinalysis:      Assessment and Plan:  Pregnancy: G2P1001 at 6747w2d  1. Supervision of other normal pregnancy, antepartum Stable Glucola next appt  2. Incarcerated gravid uterus in second trimester Resolved Currently no problems  Preterm labor symptoms and general obstetric precautions including but not limited to vaginal bleeding, contractions, leaking of fluid and fetal movement were reviewed in detail with the patient. Please refer to After Visit Summary for other counseling recommendations.  Return in about 4 weeks (around  05/08/2017) for OB visit.   Hermina StaggersErvin, Robin Falwell L, MD

## 2017-04-10 NOTE — Patient Instructions (Signed)

## 2017-04-10 NOTE — Progress Notes (Signed)
Pt states having pain in the upper left of stomach

## 2017-05-09 ENCOUNTER — Other Ambulatory Visit: Payer: Medicaid Other

## 2017-05-09 ENCOUNTER — Ambulatory Visit (INDEPENDENT_AMBULATORY_CARE_PROVIDER_SITE_OTHER): Payer: Medicaid Other | Admitting: Obstetrics & Gynecology

## 2017-05-09 VITALS — BP 114/63 | HR 94 | Wt 182.9 lb

## 2017-05-09 DIAGNOSIS — Z3483 Encounter for supervision of other normal pregnancy, third trimester: Secondary | ICD-10-CM | POA: Diagnosis present

## 2017-05-09 DIAGNOSIS — Z23 Encounter for immunization: Secondary | ICD-10-CM | POA: Diagnosis not present

## 2017-05-09 DIAGNOSIS — Z3480 Encounter for supervision of other normal pregnancy, unspecified trimester: Secondary | ICD-10-CM

## 2017-05-09 MED ORDER — PANTOPRAZOLE SODIUM 20 MG PO TBEC: 20.0000 mg | DELAYED_RELEASE_TABLET | Freq: Every day | ORAL | 1 refills | Status: DC

## 2017-05-09 NOTE — Progress Notes (Signed)
   PRENATAL VISIT NOTE  Subjective:  Robin Webster is a 27 y.o. G2P1001 at 8511w3d being seen today for ongoing prenatal care.  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  Patient reports heartburn and nausea.  Contractions: Not present. Vag. Bleeding: None.  Movement: Present. Denies leaking of fluid.   The following portions of the patient's history were reviewed and updated as appropriate: allergies, current medications, past family history, past medical history, past social history, past surgical history and problem list. Problem list updated.  Objective:   Vitals:   05/09/17 0839  BP: 114/63  Pulse: 94  Weight: 182 lb 14.4 oz (83 kg)    Fetal Status: Fetal Heart Rate (bpm): 156 Fundal Height: 29 cm Movement: Present     General:  Alert, oriented and cooperative. Patient is in no acute distress.  Skin: Skin is warm and dry. No rash noted.   Cardiovascular: Normal heart rate noted  Respiratory: Normal respiratory effort, no problems with respiration noted  Abdomen: Soft, gravid, appropriate for gestational age.  Pain/Pressure: Absent     Pelvic: Cervical exam deferred        Extremities: Normal range of motion.  Edema: None  Mental Status:  Normal mood and affect. Normal behavior. Normal judgment and thought content.   Assessment and Plan:  Pregnancy: G2P1001 at 7311w3d  1. Supervision of other normal pregnancy, antepartum Routine labs - CBC - Glucose Tolerance, 2 Hours w/1 Hour - HIV antibody - RPR - Tdap vaccine greater than or equal to 7yo IM - pantoprazole (PROTONIX) 20 MG tablet; Take 1 tablet (20 mg total) by mouth daily.  Dispense: 30 tablet; Refill: 1 For reflux Preterm labor symptoms and general obstetric precautions including but not limited to vaginal bleeding, contractions, leaking of fluid and  fetal movement were reviewed in detail with the patient. Please refer to After Visit Summary for other counseling recommendations.  Return in about 2 weeks (around 05/23/2017).   Scheryl DarterJames Rian Koon, MD

## 2017-05-09 NOTE — Patient Instructions (Signed)

## 2017-05-10 LAB — RPR: RPR Ser Ql: NONREACTIVE

## 2017-05-10 LAB — GLUCOSE TOLERANCE, 2 HOURS W/ 1HR
Glucose, 1 hour: 130 mg/dL (ref 65–179)
Glucose, 2 hour: 92 mg/dL (ref 65–152)
Glucose, Fasting: 78 mg/dL (ref 65–91)

## 2017-05-10 LAB — CBC
Hematocrit: 30.5 % — ABNORMAL LOW (ref 34.0–46.6)
Hemoglobin: 9.9 g/dL — ABNORMAL LOW (ref 11.1–15.9)
MCH: 29.6 pg (ref 26.6–33.0)
MCHC: 32.5 g/dL (ref 31.5–35.7)
MCV: 91 fL (ref 79–97)
Platelets: 246 10*3/uL (ref 150–379)
RBC: 3.35 x10E6/uL — ABNORMAL LOW (ref 3.77–5.28)
RDW: 13.2 % (ref 12.3–15.4)
WBC: 10.3 10*3/uL (ref 3.4–10.8)

## 2017-05-10 LAB — HIV ANTIBODY (ROUTINE TESTING W REFLEX): HIV Screen 4th Generation wRfx: NONREACTIVE

## 2017-05-22 ENCOUNTER — Ambulatory Visit (INDEPENDENT_AMBULATORY_CARE_PROVIDER_SITE_OTHER): Payer: Medicaid Other | Admitting: Obstetrics & Gynecology

## 2017-05-22 VITALS — BP 110/58 | HR 94 | Wt 186.1 lb

## 2017-05-22 DIAGNOSIS — Z3480 Encounter for supervision of other normal pregnancy, unspecified trimester: Secondary | ICD-10-CM

## 2017-05-22 NOTE — Patient Instructions (Signed)

## 2017-05-22 NOTE — Progress Notes (Signed)
   PRENATAL VISIT NOTE  Subjective:  Robin Webster is a 27 y.o. G2P1001 at 958w2d being seen today for ongoing prenatal care.  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  Patient reports no complaints.  Contractions: Not present. Vag. Bleeding: None.  Movement: Present. Denies leaking of fluid.   The following portions of the patient's history were reviewed and updated as appropriate: allergies, current medications, past family history, past medical history, past social history, past surgical history and problem list. Problem list updated.  Objective:   Vitals:   05/22/17 0819  BP: (!) 110/58  Pulse: 94  Weight: 186 lb 1.6 oz (84.4 kg)    Fetal Status: Fetal Heart Rate (bpm): 159   Movement: Present     General:  Alert, oriented and cooperative. Patient is in no acute distress.  Skin: Skin is warm and dry. No rash noted.   Cardiovascular: Normal heart rate noted  Respiratory: Normal respiratory effort, no problems with respiration noted  Abdomen: Soft, gravid, appropriate for gestational age.  Pain/Pressure: Absent     Pelvic: Cervical exam deferred        Extremities: Normal range of motion.  Edema: None  Mental Status:  Normal mood and affect. Normal behavior. Normal judgment and thought content.   Assessment and Plan:  Pregnancy: G2P1001 at 2658w2d  There are no diagnoses linked to this encounter. Preterm labor symptoms and general obstetric precautions including but not limited to vaginal bleeding, contractions, leaking of fluid and fetal movement were reviewed in detail with the patient. Please refer to After Visit Summary for other counseling recommendations.  Return in about 2 weeks (around 06/05/2017).   Scheryl DarterJames Santoria Chason, MD

## 2017-06-04 ENCOUNTER — Ambulatory Visit (INDEPENDENT_AMBULATORY_CARE_PROVIDER_SITE_OTHER): Payer: Medicaid Other | Admitting: Advanced Practice Midwife

## 2017-06-04 VITALS — BP 118/65 | HR 97 | Wt 185.8 lb

## 2017-06-04 DIAGNOSIS — Z3483 Encounter for supervision of other normal pregnancy, third trimester: Secondary | ICD-10-CM

## 2017-06-04 DIAGNOSIS — Z3480 Encounter for supervision of other normal pregnancy, unspecified trimester: Secondary | ICD-10-CM

## 2017-06-04 NOTE — Patient Instructions (Addendum)
Iron-Rich Diet Iron is a mineral that helps your body to produce hemoglobin. Hemoglobin is a protein in your red blood cells that carries oxygen to your body's tissues. Eating too little iron may cause you to feel weak and tired, and it can increase your risk for infection. Eating enough iron is necessary for your body's metabolism, muscle function, and nervous system. Iron is naturally found in many foods. It can also be added to foods or fortified in foods. There are two types of dietary iron:  Heme iron. Heme iron is absorbed by the body more easily than nonheme iron. Heme iron is found in meat, poultry, and fish.  Nonheme iron. Nonheme iron is found in dietary supplements, iron-fortified grains, beans, and vegetables.  You may need to follow an iron-rich diet if:  You have been diagnosed with iron deficiency or iron-deficiency anemia.  You have a condition that prevents you from absorbing dietary iron, such as: ? Infection in your intestines. ? Celiac disease. This involves long-lasting (chronic) inflammation of your intestines.  You do not eat enough iron.  You eat a diet that is high in foods that impair iron absorption.  You have lost a lot of blood.  You have heavy bleeding during your menstrual cycle.  You are pregnant.  What is my plan? Your health care provider may help you to determine how much iron you need per day based on your condition. Generally, when a person consumes sufficient amounts of iron in the diet, the following iron needs are met:  Men. ? 14-18 years old: 11 mg per day. ? 19-50 years old: 8 mg per day.  Women. ? 14-18 years old: 15 mg per day. ? 19-50 years old: 18 mg per day. ? Over 50 years old: 8 mg per day. ? Pregnant women: 27 mg per day. ? Breastfeeding women: 9 mg per day.  What do I need to know about an iron-rich diet?  Eat fresh fruits and vegetables that are high in vitamin C along with foods that are high in iron. This will help  increase the amount of iron that your body absorbs from food, especially with foods containing nonheme iron. Foods that are high in vitamin C include oranges, peppers, tomatoes, and mango.  Take iron supplements only as directed by your health care provider. Overdose of iron can be life-threatening. If you were prescribed iron supplements, take them with orange juice or a vitamin C supplement.  Cook foods in pots and pans that are made from iron.  Eat nonheme iron-containing foods alongside foods that are high in heme iron. This helps to improve your iron absorption.  Certain foods and drinks contain compounds that impair iron absorption. Avoid eating these foods in the same meal as iron-rich foods or with iron supplements. These include: ? Coffee, black tea, and red wine. ? Milk, dairy products, and foods that are high in calcium. ? Beans, soybeans, and peas. ? Whole grains.  When eating foods that contain both nonheme iron and compounds that impair iron absorption, follow these tips to absorb iron better. ? Soak beans overnight before cooking. ? Soak whole grains overnight and drain them before using. ? Ferment flours before baking, such as using yeast in bread dough. What foods can I eat? Grains Iron-fortified breakfast cereal. Iron-fortified whole-wheat bread. Enriched rice. Sprouted grains. Vegetables Spinach. Potatoes with skin. Green peas. Broccoli. Red and green bell peppers. Fermented vegetables. Fruits Prunes. Raisins. Oranges. Strawberries. Mango. Grapefruit. Meats and Other Protein Sources   Beef liver. Oysters. Beef. Shrimp. [place]. Chicken. [place]. Sardines. Chickpeas. Nuts. Tofu. Beverages Tomato juice. Fresh orange juice. Prune juice. Hibiscus tea. Fortified instant breakfast shakes. Condiments Tahini. Fermented soy sauce. Sweets and Desserts Black-strap molasses. Other Wheat germ. The items listed above may not be a complete list of recommended foods or beverages.  Contact your dietitian for more options. What foods are not recommended? Grains Whole grains. Bran cereal. Bran flour. Oats. Vegetables Artichokes. Brussels sprouts. Kale. Fruits Blueberries. Raspberries. Strawberries. Figs. Meats and Other Protein Sources Soybeans. Products made from soy protein. Dairy Milk. Cream. Cheese. Yogurt. Cottage cheese. Beverages Coffee. Black tea. Red wine. Sweets and Desserts Cocoa. Chocolate. Ice cream. Other Basil. Oregano. Parsley. The items listed above may not be a complete list of foods and beverages to avoid. Contact your dietitian for more information. This information is not intended to replace advice given to you by your health care provider. Make sure you discuss any questions you have with your health care provider. Document Released: 10/16/2004 Document Revised: 09/22/2015 Document Reviewed: 09/29/2013 Elsevier Interactive Patient Education  2018 [place] [address], [address] [place], [place]  [address] Phone - [phone]   Fax - [phone]  [facility] [address] [place], [place] [address] Phone - [phone]   Fax - [place] [address] [place], [place]  [address] Phone - [phone]   Fax - [phone]  [place]. [address] [place] [address] [place], [place]  [address] Phone - [phone]   Fax - [phone]  [place] [address] [place], [place]  [address] Phone - [phone]   Fax - [phone]  [facility] [address] [place], [place]  [address] Phone - [phone]   Fax - [place] [address] [place], [place]  [address] Phone - [phone]   Fax - [phone]  [place] [address] [place], [place] [address] [place], [place]  [address] Phone  - [phone]   Fax - [place] [address] [place], [place]  [address] Phone - [phone]   Fax - [phone] [facility] [place]. [address] [place], [place]  [address] Phone - [phone]   Fax - [phone]  [place] [address] [place], [place]  [address] Phone - [phone]   Fax - [phone]  [facility] [place], [place], [place]  [address] Phone - [phone]   Fax - [place] [address] [place], [place]  [address] Phone - [phone]   Fax - [phone]  [facility] [address] [place], [place]  [address] Phone - [place], [place]  [address] Phone - [phone]   Fax - [place] [address], [place], [place]  [address] Phone - [phone]   Fax - [phone]  [place] [address] [place], [place]  [address] Phone - [phone]   Fax - [place]. [place], [place]  [address] Phone - [phone]   Fax - [place], [place],   [address] Phone - [phone]  Fax - [place] [address], [place], [place]  [address] Phone - [phone]   Fax - [phone]  [place]. [address], [place], [place]  [address] Phone - [phone]   Fax - [place] [address] [place], [place]  [address] Phone - [phone]   Fax - [phone]  [place] [address] [employer] [place], [place]  [address] Phone - [phone]   Fax - [phone] [place] [address] [place], [place]  [address] Phone -  [phone]   Fax - [place] [address] [place], [place]  [address] Phone - [phone]   Fax - [place] [address] [place], [place]  [address] Phone - [phone]   Fax - [phone]  [place] [address] [place] [address] Phone [phone]  Fax [phone]   Contraception Choices Contraception, also called birth control, refers to methods or devices that prevent pregnancy. Hormonal methods Contraceptive implant A contraceptive implant is a thin, plastic tube that contains a hormone. It is inserted into the upper part of the arm. It can remain in place for up to 3 years. Progestin-only injections Progestin-only injections are injections of progestin, a synthetic form of the hormone progesterone. They are given every 3 months by a health care provider. Birth control pills Birth control pills are pills that contain hormones that prevent pregnancy. They must be taken once a day, preferably at the same time each day. Birth control patch The birth control patch contains hormones that prevent pregnancy. It is placed on the skin and must be changed once a week for three weeks and removed on the fourth week. A prescription is needed to use this method of contraception. Vaginal ring A vaginal ring contains hormones that prevent pregnancy. It is placed in the vagina for three weeks and removed on the fourth week. After that, the process is repeated with a new ring. A prescription is needed to use this method of contraception. Emergency contraceptive Emergency contraceptives prevent pregnancy after unprotected sex. They come in pill form and can be taken up to 5 days after sex. They work best the sooner they are taken after having sex. Most emergency contraceptives are available without a prescription. This method should not be  used as your only form of birth control. Barrier methods Female condom A female condom is a thin sheath that is worn over the penis during sex. Condoms keep sperm from going inside a woman's body. They can be used with a spermicide to increase their effectiveness. They should be disposed after a single use. Female condom A female condom is a soft, loose-fitting sheath that is put into the vagina before sex. The condom keeps sperm from going inside a woman's body. They should be disposed after a single use. Diaphragm A diaphragm is a soft, dome-shaped barrier. It is inserted into the vagina before sex, along with a spermicide. The diaphragm blocks sperm from entering the uterus, and the spermicide kills sperm. A diaphragm should be left in the vagina for 6-8 hours after sex and removed within 24 hours. A diaphragm is prescribed and fitted by a health care provider. A diaphragm should be replaced every 1-2 years, after giving birth, after gaining more than 15 lb (6.8 kg), and after pelvic surgery. Cervical cap A cervical cap is  a round, soft latex or plastic cup that fits over the cervix. It is inserted into the vagina before sex, along with spermicide. It blocks sperm from entering the uterus. The cap should be left in place for 6-8 hours after sex and removed within 48 hours. A cervical cap must be prescribed and fitted by a health care provider. It should be replaced every 2 years. Sponge A sponge is a soft, circular piece of polyurethane foam with spermicide on it. The sponge helps block sperm from entering the uterus, and the spermicide kills sperm. To use it, you make it wet and then insert it into the vagina. It should be inserted before sex, left in for at least 6 hours after sex, and removed and thrown away within 30 hours. Spermicides Spermicides are chemicals that kill or block sperm from entering the cervix and uterus. They can come as a cream, jelly, suppository, foam, or tablet. A spermicide  should be inserted into the vagina with an applicator at least 35-00 minutes before sex to allow time for it to work. The process must be repeated every time you have sex. Spermicides do not require a prescription. Intrauterine contraception Intrauterine device (IUD) An IUD is a T-shaped device that is put in a woman's uterus. There are two types:  Hormone IUD.This type contains progestin, a synthetic form of the hormone progesterone. This type can stay in place for 3-5 years.  Copper IUD.This type is wrapped in copper wire. It can stay in place for 10 years.  Permanent methods of contraception Female tubal ligation In this method, a woman's fallopian tubes are sealed, tied, or blocked during surgery to prevent eggs from traveling to the uterus. Hysteroscopic sterilization In this method, a small, flexible insert is placed into each fallopian tube. The inserts cause scar tissue to form in the fallopian tubes and block them, so sperm cannot reach an egg. The procedure takes about 3 months to be effective. Another form of birth control must be used during those 3 months. Female sterilization This is a procedure to tie off the tubes that carry sperm (vasectomy). After the procedure, the man can still ejaculate fluid (semen). Natural planning methods Natural family planning In this method, a couple does not have sex on days when the woman could become pregnant. Calendar method This means keeping track of the length of each menstrual cycle, identifying the days when pregnancy can happen, and not having sex on those days. Ovulation method In this method, a couple avoids sex during ovulation. Symptothermal method This method involves not having sex during ovulation. The woman typically checks for ovulation by watching changes in her temperature and in the consistency of cervical mucus. Post-ovulation method In this method, a couple waits to have sex until after ovulation. Summary  Contraception,  also called birth control, means methods or devices that prevent pregnancy.  Hormonal methods of contraception include implants, injections, pills, patches, vaginal rings, and emergency contraceptives.  Barrier methods of contraception can include female condoms, female condoms, diaphragms, cervical caps, sponges, and spermicides.  There are two types of IUDs (intrauterine devices). An IUD can be put in a woman's uterus to prevent pregnancy for 3-5 years.  Permanent sterilization can be done through a procedure for males, females, or both.  Natural family planning methods involve not having sex on days when the woman could become pregnant. This information is not intended to replace advice given to you by your health care provider. Make sure you discuss any questions  you have with your health care provider. Document Released: 03/04/2005 Document Revised: 04/06/2016 Document Reviewed: 04/06/2016 Elsevier Interactive Patient Education  2018 [employer].

## 2017-06-04 NOTE — Progress Notes (Signed)
   PRENATAL VISIT NOTE  Subjective:  Robin Webster is a [age] y.o. G2P1001 at 6572w1d being seen today for ongoing prenatal care.  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  Patient reports no complaints.  Contractions: Not present. Vag. Bleeding: None.  Movement: Present. Denies leaking of fluid.   The following portions of the patient's history were reviewed and updated as appropriate: allergies, current medications, past family history, past medical history, past social history, past surgical history and problem list. Problem list updated.  Objective:   Vitals:   06/04/17 0852  BP: 118/65  Pulse: 97  Weight: 185 lb 12.8 oz (84.3 kg)    Fetal Status: Fetal Heart Rate (bpm): 164 Fundal Height: 32 cm Movement: Present     General:  Alert, oriented and cooperative. Patient is in no acute distress.  Skin: Skin is warm and dry. No rash noted.   Cardiovascular: Normal heart rate noted  Respiratory: Normal respiratory effort, no problems with respiration noted  Abdomen: Soft, gravid, appropriate for gestational age.  Pain/Pressure: Absent     Pelvic: Cervical exam deferred        Extremities: Normal range of motion.  Edema: None  Mental Status:  Normal mood and affect. Normal behavior. Normal judgment and thought content.   Assessment and Plan:  Pregnancy: G2P1001 at 6872w1d  1. Encounter for supervision of other normal pregnancy in third trimester   Preterm labor symptoms and general obstetric precautions including but not limited to vaginal bleeding, contractions, leaking of fluid and fetal movement were reviewed in detail with the patient. Please refer to After Visit Summary for other counseling recommendations.  Return in 2 weeks (on 06/18/2017).   Dorathy KinsmanVirginia Mamie Webster, CNM

## 2017-06-23 ENCOUNTER — Encounter: Payer: Self-pay | Admitting: Obstetrics & Gynecology

## 2017-06-23 ENCOUNTER — Ambulatory Visit (INDEPENDENT_AMBULATORY_CARE_PROVIDER_SITE_OTHER): Payer: Medicaid Other | Admitting: Obstetrics & Gynecology

## 2017-06-23 VITALS — BP 116/65 | HR 108 | Wt 189.0 lb

## 2017-06-23 DIAGNOSIS — Z3480 Encounter for supervision of other normal pregnancy, unspecified trimester: Secondary | ICD-10-CM

## 2017-06-23 NOTE — Progress Notes (Signed)
   PRENATAL VISIT NOTE  Subjective:  Robin Webster is a 27 y.o. G2P1001 at 1015w6d being seen today for ongoing prenatal care.  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  Patient reports no complaints.  Contractions: Not present. Vag. Bleeding: None.  Movement: Present. Denies leaking of fluid.   The following portions of the patient's history were reviewed and updated as appropriate: allergies, current medications, past family history, past medical history, past social history, past surgical history and problem list. Problem list updated.  Objective:   Vitals:   06/23/17 1107  BP: 116/65  Pulse: (!) 108  Weight: 189 lb (85.7 kg)    Fetal Status: Fetal Heart Rate (bpm): 144 Fundal Height: 36 cm Movement: Present     General:  Alert, oriented and cooperative. Patient is in no acute distress.  Skin: Skin is warm and dry. No rash noted.   Cardiovascular: Normal heart rate noted  Respiratory: Normal respiratory effort, no problems with respiration noted  Abdomen: Soft, gravid, appropriate for gestational age.  Pain/Pressure: Absent     Pelvic: Cervical exam deferred        Extremities: Normal range of motion.  Edema: Trace  Mental Status: Normal mood and affect. Normal behavior. Normal judgment and thought content.   Assessment and Plan:  Pregnancy: G2P1001 at 4515w6d  1. Supervision of other normal pregnancy, antepartum Doing well  Preterm labor symptoms and general obstetric precautions including but not limited to vaginal bleeding, contractions, leaking of fluid and fetal movement were reviewed in detail with the patient. Please refer to After Visit Summary for other counseling recommendations.  Return in about 1 week (around 06/30/2017).  No future appointments.  Scheryl DarterJames Izzy Cour[place], MD

## 2017-06-23 NOTE — Patient Instructions (Signed)

## 2017-07-01 ENCOUNTER — Other Ambulatory Visit (HOSPITAL_COMMUNITY)
Admission: RE | Admit: 2017-07-01 | Discharge: 2017-07-01 | Disposition: A | Discharge status: Home / Self Care | Payer: Medicaid Other | Source: Ambulatory Visit | Attending: Obstetrics and Gynecology | Admitting: Obstetrics and Gynecology

## 2017-07-01 ENCOUNTER — Ambulatory Visit (INDEPENDENT_AMBULATORY_CARE_PROVIDER_SITE_OTHER): Payer: Medicaid Other | Admitting: Obstetrics and Gynecology

## 2017-07-01 ENCOUNTER — Encounter: Payer: Self-pay | Admitting: Obstetrics and Gynecology

## 2017-07-01 VITALS — BP 119/66 | HR 98 | Wt 189.1 lb

## 2017-07-01 DIAGNOSIS — Z3480 Encounter for supervision of other normal pregnancy, unspecified trimester: Secondary | ICD-10-CM | POA: Diagnosis present

## 2017-07-01 DIAGNOSIS — Z3483 Encounter for supervision of other normal pregnancy, third trimester: Secondary | ICD-10-CM

## 2017-07-01 DIAGNOSIS — O34513 Maternal care for incarceration of gravid uterus, third trimester: Secondary | ICD-10-CM

## 2017-07-01 DIAGNOSIS — O34512 Maternal care for incarceration of gravid uterus, second trimester: Secondary | ICD-10-CM

## 2017-07-01 LAB — OB RESULTS CONSOLE GBS: GBS: NEGATIVE

## 2017-07-01 LAB — OB RESULTS CONSOLE GC/CHLAMYDIA: Gonorrhea: NEGATIVE

## 2017-07-01 NOTE — Patient Instructions (Signed)
Vaginal Delivery Vaginal delivery means that you will give birth by pushing your baby out of your birth canal (vagina). A team of health care providers will help you before, during, and after vaginal delivery. Birth experiences are unique for every woman and every pregnancy, and birth experiences vary depending on where you choose to give birth. What should I do to prepare for my baby's birth? Before your baby is born, it is important to talk with your health care provider about:  Your labor and delivery preferences. These may include: ? Medicines that you may be given. ? How you will manage your pain. This might include non-medical pain relief techniques or injectable pain relief such as epidural analgesia. ? How you and your baby will be monitored during labor and delivery. ? Who may be in the labor and delivery room with you. ? Your feelings about surgical delivery of your baby (cesarean delivery, or C-section) if this becomes necessary. ? Your feelings about receiving donated blood through an IV tube (blood transfusion) if this becomes necessary.  Whether you are able: ? To take pictures or videos of the birth. ? To eat during labor and delivery. ? To move around, walk, or change positions during labor and delivery.  What to expect after your baby is born, such as: ? Whether delayed umbilical cord clamping and cutting is offered. ? Who will care for your baby right after birth. ? Medicines or tests that may be recommended for your baby. ? Whether breastfeeding is supported in your hospital or birth center. ? How long you will be in the hospital or birth center.  How any medical conditions you have may affect your baby or your labor and delivery experience.  To prepare for your baby's birth, you should also:  Attend all of your health care visits before delivery (prenatal visits) as recommended by your health care provider. This is important.  Prepare your home for your baby's  arrival. Make sure that you have: ? Diapers. ? Baby clothing. ? Feeding equipment. ? Safe sleeping arrangements for you and your baby.  Install a car seat in your vehicle. Have your car seat checked by a certified car seat installer to make sure that it is installed safely.  Think about who will help you with your new baby at home for at least the first several weeks after delivery.  What can I expect when I arrive at the birth center or hospital? Once you are in labor and have been admitted into the hospital or birth center, your health care provider may:  Review your pregnancy history and any concerns you have.  Insert an IV tube into one of your veins. This is used to give you fluids and medicines.  Check your blood pressure, pulse, temperature, and heart rate (vital signs).  Check whether your bag of water (amniotic sac) has broken (ruptured).  Talk with you about your birth plan and discuss pain control options.  Monitoring Your health care provider may monitor your contractions (uterine monitoring) and your baby's heart rate (fetal monitoring). You may need to be monitored:  Often, but not continuously (intermittently).  All the time or for long periods at a time (continuously). Continuous monitoring may be needed if: ? You are taking certain medicines, such as medicine to relieve pain or make your contractions stronger. ? You have pregnancy or labor complications.  Monitoring may be done by:  Placing a special stethoscope or a handheld monitoring device on your abdomen to   check your baby's heartbeat, and feeling your abdomen for contractions. This method of monitoring does not continuously record your baby's heartbeat or your contractions.  Placing monitors on your abdomen (external monitors) to record your baby's heartbeat and the frequency and length of contractions. You may not have to wear external monitors all the time.  Placing monitors inside of your uterus  (internal monitors) to record your baby's heartbeat and the frequency, length, and strength of your contractions. ? Your health care provider may use internal monitors if he or she needs more information about the strength of your contractions or your baby's heart rate. ? Internal monitors are put in place by passing a thin, flexible wire through your vagina and into your uterus. Depending on the type of monitor, it may remain in your uterus or on your baby's head until birth. ? Your health care provider will discuss the benefits and risks of internal monitoring with you and will ask for your permission before inserting the monitors.  Telemetry. This is a type of continuous monitoring that can be done with external or internal monitors. Instead of having to stay in bed, you are able to move around during telemetry. Ask your health care provider if telemetry is an option for you.  Physical exam Your health care provider may perform a physical exam. This may include:  Checking whether your baby is positioned: ? With the head toward your vagina (head-down). This is most common. ? With the head toward the top of your uterus (head-up or breech). If your baby is in a breech position, your health care provider may try to turn your baby to a head-down position so you can deliver vaginally. If it does not seem that your baby can be born vaginally, your provider may recommend surgery to deliver your baby. In rare cases, you may be able to deliver vaginally if your baby is head-up (breech delivery). ? Lying sideways (transverse). Babies that are lying sideways cannot be delivered vaginally.  Checking your cervix to determine: ? Whether it is thinning out (effacing). ? Whether it is opening up (dilating). ? How low your baby has moved into your birth canal.  What are the three stages of labor and delivery?  Normal labor and delivery is divided into the following three stages: Stage 1  Stage 1 is the  longest stage of labor, and it can last for hours or days. Stage 1 includes: ? Early labor. This is when contractions may be irregular, or regular and mild. Generally, early labor contractions are more than 10 minutes apart. ? Active labor. This is when contractions get longer, more regular, more frequent, and more intense. ? The transition phase. This is when contractions happen very close together, are very intense, and may last longer than during any other part of labor.  Contractions generally feel mild, infrequent, and irregular at first. They get stronger, more frequent (about every 2-3 minutes), and more regular as you progress from early labor through active labor and transition.  Many women progress through stage 1 naturally, but you may need help to continue making progress. If this happens, your health care provider may talk with you about: ? Rupturing your amniotic sac if it has not ruptured yet. ? Giving you medicine to help make your contractions stronger and more frequent.  Stage 1 ends when your cervix is completely dilated to 4 inches (10 cm) and completely effaced. This happens at the end of the transition phase. Stage 2  Once   your cervix is completely effaced and dilated to 4 inches (10 cm), you may start to feel an urge to push. It is common for the body to naturally take a rest before feeling the urge to push, especially if you received an epidural or certain other pain medicines. This rest period may last for up to 1-2 hours, depending on your unique labor experience.  During stage 2, contractions are generally less painful, because pushing helps relieve contraction pain. Instead of contraction pain, you may feel stretching and burning pain, especially when the widest part of your baby's head passes through the vaginal opening (crowning).  Your health care provider will closely monitor your pushing progress and your baby's progress through the vagina during stage 2.  Your  health care provider may massage the area of skin between your vaginal opening and anus (perineum) or apply warm compresses to your perineum. This helps it stretch as the baby's head starts to crown, which can help prevent perineal tearing. ? In some cases, an incision may be made in your perineum (episiotomy) to allow the baby to pass through the vaginal opening. An episiotomy helps to make the opening of the vagina larger to allow more room for the baby to fit through.  It is very important to breathe and focus so your health care provider can control the delivery of your baby's head. Your health care provider may have you decrease the intensity of your pushing, to help prevent perineal tearing.  After delivery of your baby's head, the shoulders and the rest of the body generally deliver very quickly and without difficulty.  Once your baby is delivered, the umbilical cord may be cut right away, or this may be delayed for 1-2 minutes, depending on your baby's health. This may vary among health care providers, hospitals, and birth centers.  If you and your baby are healthy enough, your baby may be placed on your chest or abdomen to help maintain the baby's temperature and to help you bond with each other. Some mothers and babies start breastfeeding at this time. Your health care team will dry your baby and help keep your baby warm during this time.  Your baby may need immediate care if he or she: ? Showed signs of distress during labor. ? Has a medical condition. ? Was born too early (prematurely). ? Had a bowel movement before birth (meconium). ? Shows signs of difficulty transitioning from being inside the uterus to being outside of the uterus. If you are planning to breastfeed, your health care team will help you begin a feeding. Stage 3  The third stage of labor starts immediately after the birth of your baby and ends after you deliver the placenta. The placenta is an organ that develops  during pregnancy to provide oxygen and nutrients to your baby in the womb.  Delivering the placenta may require some pushing, and you may have mild contractions. Breastfeeding can stimulate contractions to help you deliver the placenta.  After the placenta is delivered, your uterus should tighten (contract) and become firm. This helps to stop bleeding in your uterus. To help your uterus contract and to control bleeding, your health care provider may: ? Give you medicine by injection, through an IV tube, by mouth, or through your rectum (rectally). ? Massage your abdomen or perform a vaginal exam to remove any blood clots that are left in your uterus. ? Empty your bladder by placing a thin, flexible tube (catheter) into your bladder. ? Encourage   you to breastfeed your baby. After labor is over, you and your baby will be monitored closely to ensure that you are both healthy until you are ready to go home. Your health care team will teach you how to care for yourself and your baby. This information is not intended to replace advice given to you by your health care provider. Make sure you discuss any questions you have with your health care provider. Document Released: 12/12/2007 Document Revised: 09/22/2015 Document Reviewed: 03/19/2015 Elsevier Interactive Patient Education  2018 [employer]  

## 2017-07-01 NOTE — Progress Notes (Signed)
Subjective:  Robin Webster is a 27 y.o. G2P1001 at 5548w0d being seen today for ongoing prenatal care.  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  Patient reports no complaints.  Contractions: Not present. Vag. Bleeding: None.  Movement: Present. Denies leaking of fluid.   The following portions of the patient's history were reviewed and updated as appropriate: allergies, current medications, past family history, past medical history, past social history, past surgical history and problem list. Problem list updated.  Objective:   Vitals:   07/01/17 0952  BP: 119/66  Pulse: 98  Weight: 189 lb 1.6 oz (85.8 kg)    Fetal Status: Fetal Heart Rate (bpm): 156   Movement: Present     General:  Alert, oriented and cooperative. Patient is in no acute distress.  Skin: Skin is warm and dry. No rash noted.   Cardiovascular: Normal heart rate noted  Respiratory: Normal respiratory effort, no problems with respiration noted  Abdomen: Soft, gravid, appropriate for gestational age. Pain/Pressure: Present     Pelvic:  Cervical exam performed        Extremities: Normal range of motion.  Edema: Trace  Mental Status: Normal mood and affect. Normal behavior. Normal judgment and thought content.   Urinalysis:      Assessment and Plan:  Pregnancy: G2P1001 at 6948w0d  1. Supervision of other normal pregnancy, antepartum Stable Labor precautions - Culture, beta strep (group b only) - [facility]lam[facility]  2. 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 Please refer to After Visit Summary for other counseling  recommendations.  Return in about 1 week (around 07/08/2017) for OB visit.   Hermina StaggersErvin, Michael L, MD

## 2017-07-02 LAB — GC/CHLAMYDIA PROBE AMP (~~LOC~~) NOT AT ARMC
Chlamydia: NEGATIVE
Neisseria Gonorrhea: NEGATIVE

## 2017-07-05 LAB — CULTURE, BETA STREP (GROUP B ONLY): Strep Gp B Culture: NEGATIVE

## 2017-07-11 ENCOUNTER — Ambulatory Visit (INDEPENDENT_AMBULATORY_CARE_PROVIDER_SITE_OTHER): Payer: Medicaid Other | Admitting: Obstetrics & Gynecology

## 2017-07-11 DIAGNOSIS — Z3483 Encounter for supervision of other normal pregnancy, third trimester: Secondary | ICD-10-CM

## 2017-07-11 DIAGNOSIS — Z3480 Encounter for supervision of other normal pregnancy, unspecified trimester: Secondary | ICD-10-CM

## 2017-07-11 NOTE — Progress Notes (Signed)
   PRENATAL VISIT NOTE  Subjective:  Robin Webster is a 27 y.o. G2P1001 at 5853w3d being seen today for ongoing prenatal care.  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  Patient reports no complaints.  Contractions: Irritability. Vag. Bleeding: None.  Movement: Present. Denies leaking of fluid.   The following portions of the patient's history were reviewed and updated as appropriate: allergies, current medications, past family history, past medical history, past social history, past surgical history and problem list. Problem list updated.  Objective:   Vitals:   07/11/17 1004  BP: 115/71  Pulse: 93  Weight: 190 lb (86.2 kg)    Fetal Status: Fetal Heart Rate (bpm): 146   Movement: Present     General:  Alert, oriented and cooperative. Patient is in no acute distress.  Skin: Skin is warm and dry. No rash noted.   Cardiovascular: Normal heart rate noted  Respiratory: Normal respiratory effort, no problems with respiration noted  Abdomen: Soft, gravid, appropriate for gestational age.  Pain/Pressure: Present     Pelvic: Cervical exam deferred        Extremities: Normal range of motion.  Edema: None  Mental Status: Normal mood and affect. Normal behavior. Normal judgment and thought content.   Assessment and Plan:  Pregnancy: G2P1001 at 653w3d  1. Supervision of other normal pregnancy, antepartum   Term labor symptoms and general obstetric precautions including but not limited to vaginal bleeding, contractions, leaking of fluid and fetal movement were reviewed in detail with the patient. Please refer to After Visit Summary for other counseling recommendations.  No follow-ups on file.  Future Appointments  Date Time Provider Department Center  07/16/2017  8:55 AM Armando ReichertHogan, Heather D, CNM [facility]    Allie BossierMyra C Tunya Held,  MD

## 2017-07-16 ENCOUNTER — Encounter: Payer: Self-pay | Admitting: Advanced Practice Midwife

## 2017-07-16 ENCOUNTER — Ambulatory Visit (INDEPENDENT_AMBULATORY_CARE_PROVIDER_SITE_OTHER): Payer: Medicaid Other | Admitting: Advanced Practice Midwife

## 2017-07-16 VITALS — BP 122/70 | HR 83 | Wt 188.7 lb

## 2017-07-16 DIAGNOSIS — Z3483 Encounter for supervision of other normal pregnancy, third trimester: Secondary | ICD-10-CM

## 2017-07-16 DIAGNOSIS — Z3480 Encounter for supervision of other normal pregnancy, unspecified trimester: Secondary | ICD-10-CM

## 2017-07-16 NOTE — Progress Notes (Signed)
   PRENATAL VISIT NOTE  Subjective:  Robin Webster is a 27 y.o. G2P1001 at [redacted]w[redacted]d being seen today for ongoing prenatal care.  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  Patient reports no complaints.  Contractions: Irregular. Vag. Bleeding: None.  Movement: Present. Denies leaking of fluid.   The following portions of the patient's history were reviewed and updated as appropriate: allergies, current medications, past family history, past medical history, past social history, past surgical history and problem list. Problem list updated.  Objective:   Vitals:   07/16/17 0903  BP: 122/70  Pulse: 83  Weight: 188 lb 11.2 oz (85.6 kg)    Fetal Status: Fetal Heart Rate (bpm): 140 Fundal Height: 38 cm Movement: Present     General:  Alert, oriented and cooperative. Patient is in no acute distress.  Skin: Skin is warm and dry. No rash noted.   Cardiovascular: Normal heart rate noted  Respiratory: Normal respiratory effort, no problems with respiration noted  Abdomen: Soft, gravid, appropriate for gestational age.  Pain/Pressure: Present     Pelvic: Cervical exam deferred        Extremities: Normal range of motion.  Edema: None  Mental Status: Normal mood and affect. Normal behavior. Normal judgment and thought content.   Assessment and Plan:  Pregnancy: G2P1001 at [redacted]w[redacted]d  1. Supervision of other normal pregnancy, antepartum - Routine care  Term labor symptoms and general obstetric precautions including but not limited to vaginal bleeding, contractions, leaking of fluid and fetal movement were reviewed in detail with the patient. Please refer to After Visit Summary for other counseling recommendations.  Return in about 1 week (around 07/23/2017).  Future Appointments  Date Time Provider Department Center  07/25/2017 11:15  AM Allie Bossier, MD [facility]  07/31/2017 11:15 AM Allie Bossier, MD [facility]    Thressa Sheller, CNM

## 2017-07-16 NOTE — Patient Instructions (Signed)
Vaginal delivery means that you will give birth by pushing your baby out of your birth canal (vagina). A team of health care providers will help you before, during, and after vaginal delivery. Birth experiences are unique for every woman and every pregnancy, and birth experiences vary depending on where you choose to give birth. What should I do to prepare for my baby's birth? Before your baby is born, it is important to talk with your health care provider about:  Your labor and delivery preferences. These may include: ? Medicines that you may be given. ? How you will manage your pain. This might include non-medical pain relief techniques or injectable pain relief such as epidural analgesia. ? How you and your baby will be monitored during labor and delivery. ? Who may be in the labor and delivery room with you. ? Your feelings about surgical delivery of your baby (cesarean delivery, or C-section) if this becomes necessary. ? Your feelings about receiving donated blood through an IV tube (blood transfusion) if this becomes necessary.  Whether you are able: ? To take pictures or videos of the birth. ? To eat during labor and delivery. ? To move around, walk, or change positions during labor and delivery.  What to expect after your baby is born, such as: ? Whether delayed umbilical cord clamping and cutting is offered. ? Who will care for your baby right after birth. ? Medicines or tests that may be recommended for your baby. ? Whether breastfeeding is supported in your hospital or birth center. ? How long you will be in the hospital or birth center.  How any medical conditions you have may affect your baby or your labor and delivery experience.  To prepare for your baby's birth, you should also:  Attend all of your health care visits before delivery (prenatal visits) as recommended by your health care provider. This is important.  Prepare your home for your baby's arrival. Make sure  that you have: ? Diapers. ? Baby clothing. ? Feeding equipment. ? Safe sleeping arrangements for you and your baby.  Install a car seat in your vehicle. Have your car seat checked by a certified car seat installer to make sure that it is installed safely.  Think about who will help you with your new baby at home for at least the first several weeks after delivery.  What can I expect when I arrive at the birth center or hospital? Once you are in labor and have been admitted into the hospital or birth center, your health care provider may:  Review your pregnancy history and any concerns you have.  Insert an IV tube into one of your veins. This is used to give you fluids and medicines.  Check your blood pressure, pulse, temperature, and heart rate (vital signs).  Check whether your bag of water (amniotic sac) has broken (ruptured).  Talk with you about your birth plan and discuss pain control options.  Monitoring Your health care provider may monitor your contractions (uterine monitoring) and your baby's heart rate (fetal monitoring). You may need to be monitored:  Often, but not continuously (intermittently).  All the time or for long periods at a time (continuously). Continuous monitoring may be needed if: ? You are taking certain medicines, such as medicine to relieve pain or make your contractions stronger. ? You have pregnancy or labor complications.  Monitoring may be done by:  Placing a special stethoscope or a handheld monitoring device on your abdomen to check your   baby's heartbeat, and feeling your abdomen for contractions. This method of monitoring does not continuously record your baby's heartbeat or your contractions.  Placing monitors on your abdomen (external monitors) to record your baby's heartbeat and the frequency and length of contractions. You may not have to wear external monitors all the time.  Placing monitors inside of your uterus (internal monitors) to  record your baby's heartbeat and the frequency, length, and strength of your contractions. ? Your health care provider may use internal monitors if he or she needs more information about the strength of your contractions or your baby's heart rate. ? Internal monitors are put in place by passing a thin, flexible wire through your vagina and into your uterus. Depending on the type of monitor, it may remain in your uterus or on your baby's head until birth. ? Your health care provider will discuss the benefits and risks of internal monitoring with you and will ask for your permission before inserting the monitors.  Telemetry. This is a type of continuous monitoring that can be done with external or internal monitors. Instead of having to stay in bed, you are able to move around during telemetry. Ask your health care provider if telemetry is an option for you.  Physical exam Your health care provider may perform a physical exam. This may include:  Checking whether your baby is positioned: ? With the head toward your vagina (head-down). This is most common. ? With the head toward the top of your uterus (head-up or breech). If your baby is in a breech position, your health care provider may try to turn your baby to a head-down position so you can deliver vaginally. If it does not seem that your baby can be born vaginally, your provider may recommend surgery to deliver your baby. In rare cases, you may be able to deliver vaginally if your baby is head-up (breech delivery). ? Lying sideways (transverse). Babies that are lying sideways cannot be delivered vaginally.  Checking your cervix to determine: ? Whether it is thinning out (effacing). ? Whether it is opening up (dilating). ? How low your baby has moved into your birth canal.  What are the three stages of labor and delivery?  Normal labor and delivery is divided into the following three stages: Stage 1  Stage 1 is the longest stage of labor,  and it can last for hours or days. Stage 1 includes: ? Early labor. This is when contractions may be irregular, or regular and mild. Generally, early labor contractions are more than 10 minutes apart. ? Active labor. This is when contractions get longer, more regular, more frequent, and more intense. ? The transition phase. This is when contractions happen very close together, are very intense, and may last longer than during any other part of labor.  Contractions generally feel mild, infrequent, and irregular at first. They get stronger, more frequent (about every 2-3 minutes), and more regular as you progress from early labor through active labor and transition.  Many women progress through stage 1 naturally, but you may need help to continue making progress. If this happens, your health care provider may talk with you about: ? Rupturing your amniotic sac if it has not ruptured yet. ? Giving you medicine to help make your contractions stronger and more frequent.  Stage 1 ends when your cervix is completely dilated to 4 inches (10 cm) and completely effaced. This happens at the end of the transition phase. Stage 2  Once your cervix   is completely effaced and dilated to 4 inches (10 cm), you may start to feel an urge to push. It is common for the body to naturally take a rest before feeling the urge to push, especially if you received an epidural or certain other pain medicines. This rest period may last for up to 1-2 hours, depending on your unique labor experience.  During stage 2, contractions are generally less painful, because pushing helps relieve contraction pain. Instead of contraction pain, you may feel stretching and burning pain, especially when the widest part of your baby's head passes through the vaginal opening (crowning).  Your health care provider will closely monitor your pushing progress and your baby's progress through the vagina during stage 2.  Your health care provider may  massage the area of skin between your vaginal opening and anus (perineum) or apply warm compresses to your perineum. This helps it stretch as the baby's head starts to crown, which can help prevent perineal tearing. ? In some cases, an incision may be made in your perineum (episiotomy) to allow the baby to pass through the vaginal opening. An episiotomy helps to make the opening of the vagina larger to allow more room for the baby to fit through.  It is very important to breathe and focus so your health care provider can control the delivery of your baby's head. Your health care provider may have you decrease the intensity of your pushing, to help prevent perineal tearing.  After delivery of your baby's head, the shoulders and the rest of the body generally deliver very quickly and without difficulty.  Once your baby is delivered, the umbilical cord may be cut right away, or this may be delayed for 1-2 minutes, depending on your baby's health. This may vary among health care providers, hospitals, and birth centers.  If you and your baby are healthy enough, your baby may be placed on your chest or abdomen to help maintain the baby's temperature and to help you bond with each other. Some mothers and babies start breastfeeding at this time. Your health care team will dry your baby and help keep your baby warm during this time.  Your baby may need immediate care if he or she: ? Showed signs of distress during labor. ? Has a medical condition. ? Was born too early (prematurely). ? Had a bowel movement before birth (meconium). ? Shows signs of difficulty transitioning from being inside the uterus to being outside of the uterus. If you are planning to breastfeed, your health care team will help you begin a feeding. Stage 3  The third stage of labor starts immediately after the birth of your baby and ends after you deliver the placenta. The placenta is an organ that develops during pregnancy to provide  oxygen and nutrients to your baby in the womb.  Delivering the placenta may require some pushing, and you may have mild contractions. Breastfeeding can stimulate contractions to help you deliver the placenta.  After the placenta is delivered, your uterus should tighten (contract) and become firm. This helps to stop bleeding in your uterus. To help your uterus contract and to control bleeding, your health care provider may: ? Give you medicine by injection, through an IV tube, by mouth, or through your rectum (rectally). ? Massage your abdomen or perform a vaginal exam to remove any blood clots that are left in your uterus. ? Empty your bladder by placing a thin, flexible tube (catheter) into your bladder. ? Encourage you to   breastfeed your baby. After labor is over, you and your baby will be monitored closely to ensure that you are both healthy until you are ready to go home. Your health care team will teach you how to care for yourself and your baby. This information is not intended to replace advice given to you by your health care provider. Make sure you discuss any questions you have with your health care provider. Document Released: 12/12/2007 Document Revised: 09/22/2015 Document Reviewed: 03/19/2015 Elsevier Interactive Patient Education  2018 [employer]  

## 2017-07-22 ENCOUNTER — Encounter (HOSPITAL_COMMUNITY): Payer: Self-pay

## 2017-07-22 ENCOUNTER — Ambulatory Visit (HOSPITAL_COMMUNITY)
Admission: EM | Admit: 2017-07-22 | Discharge: 2017-07-22 | Disposition: A | Discharge status: Home / Self Care | Payer: Medicaid Other | Attending: Family Medicine | Admitting: Family Medicine

## 2017-07-22 DIAGNOSIS — J0100 Acute maxillary sinusitis, unspecified: Secondary | ICD-10-CM

## 2017-07-22 MED ORDER — AMOXICILLIN 875 MG PO TABS
875.0000 mg | ORAL_TABLET | Freq: Two times a day (BID) | ORAL | 0 refills | Status: AC
Start: 2017-07-22 — End: 2017-08-01

## 2017-07-22 NOTE — ED Notes (Signed)
Pt discharged by provider.

## 2017-07-22 NOTE — ED Triage Notes (Signed)
Pt presents with complaints of ongoing facial pain. Pt is 39 weeks.

## 2017-07-23 ENCOUNTER — Inpatient Hospital Stay (EMERGENCY_DEPARTMENT_HOSPITAL)
Admission: AD | Admit: 2017-07-23 | Discharge: 2017-07-23 | Disposition: A | Discharge status: Home / Self Care | Payer: Medicaid Other | Source: Ambulatory Visit | Attending: Family Medicine | Admitting: Family Medicine

## 2017-07-23 ENCOUNTER — Inpatient Hospital Stay (HOSPITAL_COMMUNITY)
Admission: AD | Admit: 2017-07-23 | Discharge: 2017-07-25 | DRG: 807 | Disposition: A | Discharge status: Home / Self Care | Payer: Medicaid Other | Source: Ambulatory Visit | Attending: Obstetrics and Gynecology | Admitting: Obstetrics and Gynecology

## 2017-07-23 ENCOUNTER — Encounter (HOSPITAL_COMMUNITY): Payer: Self-pay | Admitting: *Deleted

## 2017-07-23 ENCOUNTER — Other Ambulatory Visit: Payer: Self-pay

## 2017-07-23 ENCOUNTER — Encounter (HOSPITAL_COMMUNITY): Payer: Self-pay

## 2017-07-23 DIAGNOSIS — K0889 Other specified disorders of teeth and supporting structures: Secondary | ICD-10-CM

## 2017-07-23 DIAGNOSIS — O99214 Obesity complicating childbirth: Secondary | ICD-10-CM | POA: Diagnosis present

## 2017-07-23 DIAGNOSIS — O34512 Maternal care for incarceration of gravid uterus, second trimester: Secondary | ICD-10-CM

## 2017-07-23 DIAGNOSIS — Z3480 Encounter for supervision of other normal pregnancy, unspecified trimester: Secondary | ICD-10-CM

## 2017-07-23 DIAGNOSIS — D649 Anemia, unspecified: Secondary | ICD-10-CM | POA: Diagnosis present

## 2017-07-23 DIAGNOSIS — Z87891 Personal history of nicotine dependence: Secondary | ICD-10-CM | POA: Diagnosis not present

## 2017-07-23 DIAGNOSIS — O479 False labor, unspecified: Secondary | ICD-10-CM

## 2017-07-23 DIAGNOSIS — E669 Obesity, unspecified: Secondary | ICD-10-CM | POA: Diagnosis present

## 2017-07-23 DIAGNOSIS — Z3A39 39 weeks gestation of pregnancy: Secondary | ICD-10-CM

## 2017-07-23 DIAGNOSIS — K047 Periapical abscess without sinus: Secondary | ICD-10-CM | POA: Diagnosis present

## 2017-07-23 DIAGNOSIS — O9902 Anemia complicating childbirth: Secondary | ICD-10-CM | POA: Diagnosis present

## 2017-07-23 DIAGNOSIS — Z3483 Encounter for supervision of other normal pregnancy, third trimester: Secondary | ICD-10-CM | POA: Diagnosis present

## 2017-07-23 DIAGNOSIS — O26893 Other specified pregnancy related conditions, third trimester: Secondary | ICD-10-CM | POA: Diagnosis present

## 2017-07-23 DIAGNOSIS — J0100 Acute maxillary sinusitis, unspecified: Secondary | ICD-10-CM

## 2017-07-23 LAB — CBC
HCT: 35 % — ABNORMAL LOW (ref 36.0–46.0)
Hemoglobin: 11.2 g/dL — ABNORMAL LOW (ref 12.0–15.0)
MCH: 28.4 pg (ref 26.0–34.0)
MCHC: 32 g/dL (ref 30.0–36.0)
MCV: 88.6 fL (ref 78.0–100.0)
Platelets: 245 10*3/uL (ref 150–400)
RBC: 3.95 MIL/uL (ref 3.87–5.11)
RDW: 13.8 % (ref 11.5–15.5)
WBC: 13 10*3/uL — ABNORMAL HIGH (ref 4.0–10.5)

## 2017-07-23 LAB — TYPE AND SCREEN
ABO/RH(D): O POS
Antibody Screen: NEGATIVE

## 2017-07-23 LAB — POCT FERN TEST: POCT Fern Test: POSITIVE

## 2017-07-23 MED ORDER — LIDOCAINE HCL (PF) 1 % IJ SOLN
30.0000 mL | INTRAMUSCULAR | Status: DC | PRN
  Filled 2017-07-23: qty 30

## 2017-07-23 MED ORDER — SOD CITRATE-CITRIC ACID 500-334 MG/5ML PO SOLN: 30.0000 mL | ORAL | Status: DC | PRN

## 2017-07-23 MED ORDER — OXYCODONE-ACETAMINOPHEN 5-325 MG PO TABS
2.0000 | ORAL_TABLET | Freq: Once | ORAL | Status: AC
  Administered 2017-07-23: 2 via ORAL
  Filled 2017-07-23: qty 2

## 2017-07-23 MED ORDER — LACTATED RINGERS IV SOLN
INTRAVENOUS | Status: DC
  Administered 2017-07-23 – 2017-07-24 (×2): via INTRAVENOUS

## 2017-07-23 MED ORDER — OXYTOCIN BOLUS FROM INFUSION
500.0000 mL | Freq: Once | INTRAVENOUS | Status: AC
  Administered 2017-07-24: 500 mL via INTRAVENOUS

## 2017-07-23 MED ORDER — ONDANSETRON HCL 4 MG/2ML IJ SOLN
4.0000 mg | Freq: Four times a day (QID) | INTRAMUSCULAR | Status: DC | PRN
  Administered 2017-07-23: 4 mg via INTRAVENOUS
  Filled 2017-07-23: qty 2

## 2017-07-23 MED ORDER — OXYTOCIN 40 UNITS IN LACTATED RINGERS INFUSION - SIMPLE MED
2.5000 [IU]/h | INTRAVENOUS | Status: DC
  Filled 2017-07-23: qty 1000

## 2017-07-23 MED ORDER — OXYCODONE-ACETAMINOPHEN 5-325 MG PO TABS: 1.0000 | ORAL_TABLET | ORAL | Status: DC | PRN

## 2017-07-23 MED ORDER — OXYCODONE-ACETAMINOPHEN 5-325 MG PO TABS: 1.0000 | ORAL_TABLET | ORAL | 0 refills | Status: DC | PRN

## 2017-07-23 MED ORDER — FENTANYL CITRATE (PF) 100 MCG/2ML IJ SOLN
100.0000 ug | INTRAMUSCULAR | Status: DC | PRN
  Administered 2017-07-23: 100 ug via INTRAVENOUS
  Filled 2017-07-23 (×2): qty 2

## 2017-07-23 MED ORDER — FLEET ENEMA 7-19 GM/118ML RE ENEM: 1.0000 | ENEMA | RECTAL | Status: DC | PRN

## 2017-07-23 MED ORDER — OXYCODONE-ACETAMINOPHEN 5-325 MG PO TABS: 2.0000 | ORAL_TABLET | ORAL | Status: DC | PRN

## 2017-07-23 MED ORDER — LACTATED RINGERS IV SOLN: 500.0000 mL | INTRAVENOUS | Status: DC | PRN

## 2017-07-23 MED ORDER — ACETAMINOPHEN 325 MG PO TABS: 650.0000 mg | ORAL_TABLET | ORAL | Status: DC | PRN

## 2017-07-23 NOTE — MAU Note (Signed)
ALSO WENT  TO URGENT CARE YESTERDAY - REC ANTX- HAS ONLY TAKEN 1 DOSE . NOTHING  FOR PAIN

## 2017-07-23 NOTE — MAU Note (Signed)
PT SAYS LAST MOVEMENT  WAS 0130 -   [facility]  WITH  CLINIC.   ALSO SAYS  HAS A LEFT UPPER TOOTH ACHE -   TAKES TYLENOL XS 2 TABS -   TOOK AT 2330.   SAYS IT'S A CAVITY - NO DENTIST  APPOINTMENT

## 2017-07-23 NOTE — MAU Note (Signed)
Pt states that she has been walking since 1900 tonight trying to get labor started. She states that at 2100 she felt a big gush.   Denies vaginal bleeding but has had some slightly blood tinged mucous.  Reports good fetal movement.

## 2017-07-23 NOTE — ED Provider Notes (Signed)
[facility]   [id number] 07/22/17 Arrival Time: 1915  ASSESSMENT & PLAN:  1. Acute non-recurrent maxillary sinusitis     Meds ordered this encounter  Medications  . amoxicillin (AMOXIL) 875 MG tablet    Sig: Take 1 tablet (875 mg total) by mouth 2 (two) times daily for 10 days.    Dispense:  20 tablet    Refill:  0   Discussed typical duration of symptoms. OTC symptom care as needed. May f/u with PCP or here as needed.  Reviewed expectations re: course of current medical issues. Questions answered. Outlined signs and symptoms indicating need for more acute intervention. Patient verbalized understanding. After Visit Summary given.   SUBJECTIVE: History from: patient.  Robin Webster is a 27 y.o. female who presents with complaint of nasal congestion, post-nasal drainage, and sinus pain. Onset gradual, over the past week. Respiratory symptoms: none Fever: no. Overall normal PO intake without n/v. OTC treatment: none . History of frequent sinus infections: no.  Pregnant at [redacted] weeks.  Social History   Tobacco Use  Smoking Status Former Smoker  . Packs/day: 0.25  . Types: Cigarettes  . Last attempt to quit: 01/26/2017  . Years since quitting: 0.4  Smokeless Tobacco Never Used  Tobacco Comment   is trying to quit    ROS: As per HPI.   OBJECTIVE:  Vitals:   07/22/17 1928  BP: 123/88  Pulse: (!) 18  Resp: 18  Temp: 98.6 F (37 C)  SpO2: 99%    Recheck pulse: 90  General appearance: alert; appears fatigued HEENT: nasal congestion; clear runny nose; throat irritation secondary to post-nasal drainage; L-sided maxillary tenderness to palpation; turbinates boggy Neck: supple without LAD Lungs: unlabored respirations, symmetrical air entry; cough: absent; no respiratory distress Skin: warm and dry Psychological: alert and cooperative; normal mood and affect  No Known Allergies  Past Medical History:  Diagnosis Date  . Vaginal Pap smear, abnormal     Family History  Problem Relation Age of Onset  . Hypertension Father    Social History   Socioeconomic History  . Marital status: Single    Spouse name: Not on file  . Number of children: Not on file  . Years of education: Not on file  . Highest education level: Not on file  Occupational History  . Not on file  Social Needs  . Financial resource strain: Not on file  . Food insecurity:    Worry: Not on file    Inability: Not on file  . Transportation needs:    Medical: Not on file    Non-medical: Not on file  Tobacco Use  . Smoking status: Former Smoker    Packs/day: 0.25    Types: Cigarettes    Last attempt to quit: 01/26/2017    Years since quitting: 0.4  . Smokeless tobacco: Never Used  . Tobacco comment: is trying to quit  Substance and Sexual Activity  . Alcohol use: Yes    Comment: occasionally, none since pregnant  . Drug use: Yes    Types: Marijuana    Comment: last used 2013   . Sexual activity: Yes    Birth control/protection: None  Lifestyle  . Physical activity:    Days per week: Not on file    Minutes per session: Not on file  . Stress: Not on file  Relationships  . Social connections:    Talks on phone: Not on file    Gets together: Not on file  Attends religious service: Not on file    Active member of club or organization: Not on file    Attends meetings of clubs or organizations: Not on file    Relationship status: Not on file  . Intimate partner violence:    Fear of current or ex partner: Not on file    Emotionally abused: Not on file    Physically abused: Not on file    Forced sexual activity: Not on file  Other Topics Concern  . Not on file  Social History Narrative  . Not on file            Mardella Layman, MD 07/23/17 [phone]

## 2017-07-23 NOTE — H&P (Signed)
Obstetric History and Physical  Robin Webster is a 27 y.o. G2P1001 with IUP at [redacted]w[redacted]d presenting for SROM around 2100. Patient states she has been having  irregular contractions, minimal vaginal bleeding, ruptured, clear fluid membranes, with active fetal movement.  No blurry vision, headaches or peripheral edema, and RUQ pain.   Prenatal Course Source of Care: [facility]  Dating: By 14wk [place] --->  Estimated Date of Delivery: 07/29/17 Pregnancy complications or risks: Patient Active Problem List   Diagnosis Date Noted  . 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 Supervision of other normal pregnancy, antepartum 01/22/2017   She plans to breastfeed She is undecided for postpartum contraception.   Sono:    , CWD, normal anatomy, breech presentation, left lateral placenta, 280g, 49% EFW  Prenatal labs and studies: ABO, Rh: O/Positive/-- (11/07 1152) Antibody: Negative (11/07 1152) Rubella: 1.74 (11/07 1152) RPR: Non Reactive (02/22 0949)  HBsAg: Negative (11/07 1152)  HIV: Non Reactive (02/22 0949)  GBS: Negative 2 hr Glucola  normal Genetic screening normal Anatomy US normal  Prenatal Transfer Tool  Maternal Diabetes: No Genetic Screening: Normal Maternal Ultrasounds/Referrals: Normal Fetal Ultrasounds or other Referrals:  None Maternal Substance Abuse:  No Significant Maternal Medications:  None Significant Maternal Lab Results: None  Past Medical History:  Diagnosis Date  . Vaginal Pap smear, abnormal     Past Surgical History:  Procedure Laterality Date  . NO PAST SURGERIES      OB History  Gravida Para Term Preterm AB Living  SAB TAB Ectopic Multiple Live Births          1    # Outcome Date GA Lbr Len/2nd Weight Sex Delivery Anes PTL Lv  2 Current           1 Term 06/25/05 [redacted]w[redacted]d  3.26 kg (7 lb 3 oz) F Vag-Spont None  LIV     Birth Comments: no complications    Social History   Socioeconomic History  . Marital status:  Single    Spouse name: Not on file  . Number of children: Not on file  . Years of education: Not on file  . Highest education level: Not on file  Occupational History  . Not on file  Social Needs  . Financial resource strain: Not on file  . Food insecurity:    Worry: Not on file    Inability: Not on file  . Transportation needs:    Medical: Not on file    Non-medical: Not on file  Tobacco Use  . Smoking status: Former Smoker    Packs/day: 0.25    Types: Cigarettes    Last attempt to quit: 01/26/2017    Years since quitting: 0.4  . Smokeless tobacco: Never Used  . Tobacco comment: is trying to quit  Substance and Sexual Activity  . Alcohol use: Yes    Comment: occasionally, none since pregnant  . Drug use: Yes    Types: Marijuana    Comment: last used 2013   . Sexual activity: Yes    Birth control/protection: None  Lifestyle  . Physical activity:    Days per week: Not on file    Minutes per session: Not on file  . Stress: Not on file  Relationships  . Social connections:    Talks on phone: Not on file    Gets together: Not on file    Attends religious service: Not on file  Active member of club or organization: Not on file    Attends meetings of clubs or organizations: Not on file    Relationship status: Not on file  Other Topics Concern  . Not on file  Social History Narrative  . Not on file    Family History  Problem Relation Age of Onset  . Hypertension Father     Medications Prior to Admission  Medication Sig Dispense Refill Last Dose  . acetaminophen (TYLENOL) 500 MG tablet Take 1,000 mg by mouth every 6 (six) hours as needed for mild pain.   07/22/2017 at Unknown time  . amoxicillin (AMOXIL) 875 MG tablet Take 1 tablet (875 mg total) by mouth 2 (two) times daily for 10 days. 20 tablet 0 07/22/2017 at Unknown time  . oxyCODONE-acetaminophen (PERCOCET/ROXICET) 5-325 MG tablet Take 1 tablet by mouth every 4 (four) hours as needed for up to 5 days for severe  pain. 15 tablet 0   . pantoprazole (PROTONIX) 20 MG tablet Take 1 tablet (20 mg total) by mouth daily. 30 tablet 1 07/22/2017 at Unknown time  . Prenat w/o A Vit-FeFum-FePo-FA (CONCEPT OB) 130-92.4-1 MG CAPS Take 1 tablet daily by mouth. 30 capsule 12 07/22/2017 at Unknown time    No Known Allergies  Review of Systems: Negative except for what is mentioned in HPI.  Physical Exam: BP 119/78 (BP Location: Left Arm)   Pulse (!) 101   Temp 98.1 F (36.7 C) (Oral)   Resp 16   Wt 88.2 kg (194 lb 8 oz)   LMP 10/18/2016 (Approximate) Comment: Irreg cycles  SpO2 100%   BMI 34.45 kg/m  CONSTITUTIONAL: Well-developed, well-nourished female in no acute distress.  HENT:  Normocephalic, atraumatic, External right and left ear normal. Oropharynx is clear and moist EYES: Conjunctivae and EOM are normal. Pupils are equal, round, and reactive to light. No scleral icterus.  NECK: Normal range of motion, supple, no masses SKIN: Skin is warm and dry. No rash noted. Not diaphoretic. No erythema. No pallor. NEUROLOGIC: Alert and oriented to person, place, and time. Normal reflexes, muscle tone coordination. No cranial nerve deficit noted. PSYCHIATRIC: Normal mood and affect. Normal behavior. Normal judgment and thought content. CARDIOVASCULAR: Normal heart rate noted, regular rhythm RESPIRATORY: Effort and breath sounds normal, no problems with respiration noted ABDOMEN: Soft, nontender, nondistended, gravid. MUSCULOSKELETAL: Normal range of motion. No edema and no tenderness. 2+ distal pulses.  Cervical Exam: Dilation: 3 Effacement (%): 50 Cervical Position: Posterior Station: -3 Presentation: Vertex Exam by:: Zenia Resides, RN   FHT:  Baseline rate 140 bpm   Variability moderate  Accelerations present   Decelerations none Contractions: Every 2-3 mins   Pertinent Labs/Studies:   No results found for this or any previous visit (from the past 24 hour(s)).  Assessment : Robin Webster is a 27 y.o.  G2P1001 at [redacted]w[redacted]d being admitted for SROM. Early labor.  Plan: Labor: Expectant management. Will see if patient continues to labor normally; may need to augment.  Analgesia as needed. FWB: Reassuring fetal heart tracing.   GBS negative Delivery plan: Hopeful for vaginal delivery   Caryl Ada, DO OB Fellow Faculty Practice, [facility] [place] 07/23/2017, 9:50 PM

## 2017-07-23 NOTE — MAU Provider Note (Signed)
Chief Complaint:  Dental Pain   First Provider Initiated Contact with Patient 07/23/17 0500      HPI: Robin Webster is a 27 y.o. G2P1001 at 26w1dwho presents to maternity admissions reporting dental pain. She reports being seen at urgent care yesterday for dental cavity. She reports being prescribed amoxicillin but no pain medication. She reports continued tooth ache that she rates 9/10- reports being unable to sleep for the past 2 days due to being in pain, she has tried Tylenol 1,000mg  for pain with no relief. She reports not having a dentist appointment soon and waiting for them to schedule an appointment. She reports decreased fetal movement. She reports last movement was around 0130 this morning. She reports movement has been felt since being on the monitor. She denies LOF, vaginal bleeding, vaginal itching/burning, urinary symptoms, h/a, dizziness, n/v, or fever/chills.    Past Medical History: Past Medical History:  Diagnosis Date  . Vaginal Pap smear, abnormal     Past obstetric history: OB History  Gravida Para Term Preterm AB Living  SAB TAB Ectopic Multiple Live Births          1    # Outcome Date GA Lbr Len/2nd Weight Sex Delivery Anes PTL Lv  2 Current           1 Term 06/25/05 [redacted]w[redacted]d  7 lb 3 oz (3.26 kg) F Vag-Spont None  LIV     Birth Comments: no complications    Past Surgical History: Past Surgical History:  Procedure Laterality Date  . NO PAST SURGERIES      Family History: Family History  Problem Relation Age of Onset  . Hypertension Father     Social History: Social History   Tobacco Use  . Smoking status: Former Smoker    Packs/day: 0.25    Types: Cigarettes    Last attempt to quit: 01/26/2017    Years since quitting: 0.4  . Smokeless tobacco: Never Used  . Tobacco comment: is trying to quit  Substance Use Topics  . Alcohol use: Yes    Comment: occasionally, none since pregnant  . Drug use: Yes    Types: Marijuana    Comment: last  used 2013     Allergies: No Known Allergies  Meds:  Medications Prior to Admission  Medication Sig Dispense Refill Last Dose  . acetaminophen (TYLENOL) 500 MG tablet Take 1,000 mg by mouth every 6 (six) hours as needed for mild pain.   07/22/2017 at Unknown time  . amoxicillin (AMOXIL) 875 MG tablet Take 1 tablet (875 mg total) by mouth 2 (two) times daily for 10 days. 20 tablet 0 07/22/2017 at Unknown time  . pantoprazole (PROTONIX) 20 MG tablet Take 1 tablet (20 mg total) by mouth daily. 30 tablet 1 07/22/2017 at Unknown time  . Prenat w/o A Vit-FeFum-FePo-FA (CONCEPT OB) 130-92.4-1 MG CAPS Take 1 tablet daily by mouth. 30 capsule 12 07/22/2017 at Unknown time    ROS:  Review of Systems  Constitutional: Negative.   HENT: Positive for dental problem.   Respiratory: Negative.   Cardiovascular: Negative.   Gastrointestinal: Negative.   Genitourinary: Negative.   Musculoskeletal: Negative.    I have reviewed patient's Past Medical Hx, Surgical Hx, Family Hx, Social Hx, medications and allergies.   Physical Exam   Patient Vitals for the past 24 hrs:  BP Temp Temp src Pulse Resp Height Weight  07/23/17 0626 127/73 - - 86 - - -  07/23/17 0421 110/65 97.9 F (36.6 C) Oral 93 20  (1.6 m) 190 lb 4 oz (86.3 kg)   Constitutional: Well-developed, well-nourished female in no acute distress.  Cardiovascular: normal rate Respiratory: normal effort GI: Abd soft, non-tender, gravid appropriate for gestational age.  MS: Extremities nontender, no edema, normal ROM Neurologic: Alert and oriented x 4.  GU: Neg CVAT.  CERVICAL EXAM: Dilation: Closed Effacement (%): Thick Exam by:: Lanice Shirts CNM   FHT:  Baseline 145 , moderate variability, accelerations present, no decelerations Contractions: q 4-5 mins/ mild by palpation   MAU Course/MDM:  Meds ordered this encounter  Medications  . oxyCODONE-acetaminophen (PERCOCET/ROXICET) 5-325 MG per tablet 2 tablet  . oxyCODONE-acetaminophen  (PERCOCET/ROXICET) 5-325 MG tablet    Sig: Take 1 tablet by mouth every 4 (four) hours as needed for up to 5 days for severe pain.    Dispense:  15 tablet    Refill:  0    Order Specific Question:   Supervising Provider    Answer:   Levie Heritage [[id number]]    NST reviewed- reactive   Treatments in MAU included Percocet 2 tablets for dental pain, patient reports no pain after medication treatment.   Pt discharge with with labor precautions   Assessment: 1. Acute non-recurrent maxillary sinusitis   2. Toothache   3. Braxton Hicks contractions     Plan: Discharge home Labor precautions and fetal kick counts Rx for Percocet Follow up with dentist office for cavity  Return to MAU as needed for labor evaluation   Follow-up Information    [facility] Follow up.   Specialty:  Obstetrics and Gynecology Why:  Follow up as scheduled for prenatal appointments. Follow up with dentist for sinusitis  Contact information: [address] [place] [address] [phone]          Allergies as of 07/23/2017   No Known Allergies     Medication List    TAKE these medications   acetaminophen 500 MG tablet Commonly known as:  TYLENOL Take 1,000 mg by mouth every 6 (six) hours as needed for mild pain.   amoxicillin 875 MG tablet Commonly known as:  AMOXIL Take 1 tablet (875 mg total) by mouth 2 (two) times daily for 10 days.   CONCEPT OB 130-92.4-1 MG Caps Take 1 tablet daily by mouth.   oxyCODONE-acetaminophen 5-325 MG tablet Commonly known as:  PERCOCET/ROXICET Take 1 tablet by mouth every 4 (four) hours as needed for up to 5 days for severe pain.   pantoprazole 20 MG tablet Commonly known as:  PROTONIX Take 1 tablet (20 mg total) by mouth daily.      Steward Drone Certified Nurse-Midwife 07/23/2017 6:19 AM

## 2017-07-24 ENCOUNTER — Encounter (HOSPITAL_COMMUNITY): Payer: Self-pay | Admitting: Anesthesiology

## 2017-07-24 ENCOUNTER — Inpatient Hospital Stay (HOSPITAL_COMMUNITY): Payer: Medicaid Other | Admitting: Anesthesiology

## 2017-07-24 ENCOUNTER — Other Ambulatory Visit: Payer: Self-pay

## 2017-07-24 DIAGNOSIS — Z3A39 39 weeks gestation of pregnancy: Secondary | ICD-10-CM

## 2017-07-24 LAB — RPR: RPR Ser Ql: NONREACTIVE

## 2017-07-24 LAB — ABO/RH: ABO/RH(D): O POS

## 2017-07-24 MED ORDER — ACETAMINOPHEN 325 MG PO TABS: 650.0000 mg | ORAL_TABLET | ORAL | Status: DC | PRN

## 2017-07-24 MED ORDER — ONDANSETRON HCL 4 MG PO TABS: 4.0000 mg | ORAL_TABLET | ORAL | Status: DC | PRN

## 2017-07-24 MED ORDER — EPHEDRINE 5 MG/ML INJ
10.0000 mg | INTRAVENOUS | Status: DC | PRN
  Filled 2017-07-24: qty 2

## 2017-07-24 MED ORDER — OXYTOCIN 40 UNITS IN LACTATED RINGERS INFUSION - SIMPLE MED
1.0000 m[IU]/min | INTRAVENOUS | Status: DC
  Administered 2017-07-24: 2 m[IU]/min via INTRAVENOUS

## 2017-07-24 MED ORDER — LACTATED RINGERS IV SOLN: 500.0000 mL | Freq: Once | INTRAVENOUS | Status: DC

## 2017-07-24 MED ORDER — ZOLPIDEM TARTRATE 5 MG PO TABS: 5.0000 mg | ORAL_TABLET | Freq: Every evening | ORAL | Status: DC | PRN

## 2017-07-24 MED ORDER — COCONUT OIL OIL: 1.0000 "application " | TOPICAL_OIL | Status: DC | PRN

## 2017-07-24 MED ORDER — TETANUS-DIPHTH-ACELL PERTUSSIS 5-2.5-18.5 LF-MCG/0.5 IM SUSP: 0.5000 mL | Freq: Once | INTRAMUSCULAR | Status: DC

## 2017-07-24 MED ORDER — DIPHENHYDRAMINE HCL 25 MG PO CAPS
25.0000 mg | ORAL_CAPSULE | Freq: Four times a day (QID) | ORAL | Status: DC | PRN
Start: 2017-07-24 — End: 2017-07-25

## 2017-07-24 MED ORDER — SENNOSIDES-DOCUSATE SODIUM 8.6-50 MG PO TABS
2.0000 | ORAL_TABLET | ORAL | Status: DC
  Administered 2017-07-24: 2 via ORAL
  Filled 2017-07-24: qty 2

## 2017-07-24 MED ORDER — SIMETHICONE 80 MG PO CHEW: 80.0000 mg | CHEWABLE_TABLET | ORAL | Status: DC | PRN

## 2017-07-24 MED ORDER — FENTANYL 2.5 MCG/ML BUPIVACAINE 1/10 % EPIDURAL INFUSION (WH - ANES)
14.0000 mL/h | INTRAMUSCULAR | Status: DC | PRN
  Administered 2017-07-24 (×2): 14 mL/h via EPIDURAL
  Filled 2017-07-24 (×2): qty 100

## 2017-07-24 MED ORDER — IBUPROFEN 600 MG PO TABS
600.0000 mg | ORAL_TABLET | Freq: Four times a day (QID) | ORAL | Status: DC
  Administered 2017-07-24 – 2017-07-25 (×4): 600 mg via ORAL
  Filled 2017-07-24 (×4): qty 1

## 2017-07-24 MED ORDER — OXYCODONE-ACETAMINOPHEN 5-325 MG PO TABS
1.0000 | ORAL_TABLET | Freq: Four times a day (QID) | ORAL | Status: DC | PRN
  Administered 2017-07-24 – 2017-07-25 (×3): 1 via ORAL
  Filled 2017-07-24 (×3): qty 1

## 2017-07-24 MED ORDER — LIDOCAINE HCL (PF) 1 % IJ SOLN
INTRAMUSCULAR | Status: DC | PRN
  Administered 2017-07-24 (×2): 4 mL via EPIDURAL

## 2017-07-24 MED ORDER — DIBUCAINE 1 % RE OINT: 1.0000 "application " | TOPICAL_OINTMENT | RECTAL | Status: DC | PRN

## 2017-07-24 MED ORDER — PRENATAL MULTIVITAMIN CH: 1.0000 | ORAL_TABLET | Freq: Every day | ORAL | Status: DC

## 2017-07-24 MED ORDER — ONDANSETRON HCL 4 MG/2ML IJ SOLN: 4.0000 mg | INTRAMUSCULAR | Status: DC | PRN

## 2017-07-24 MED ORDER — PHENYLEPHRINE 40 MCG/ML (10ML) SYRINGE FOR IV PUSH (FOR BLOOD PRESSURE SUPPORT)
80.0000 ug | PREFILLED_SYRINGE | INTRAVENOUS | Status: DC | PRN
  Filled 2017-07-24: qty 5

## 2017-07-24 MED ORDER — PHENYLEPHRINE 40 MCG/ML (10ML) SYRINGE FOR IV PUSH (FOR BLOOD PRESSURE SUPPORT)
80.0000 ug | PREFILLED_SYRINGE | INTRAVENOUS | Status: DC | PRN
  Filled 2017-07-24: qty 10
  Filled 2017-07-24: qty 5

## 2017-07-24 MED ORDER — BENZOCAINE-MENTHOL 20-0.5 % EX AERO
1.0000 "application " | INHALATION_SPRAY | CUTANEOUS | Status: DC | PRN
  Administered 2017-07-24: 1 via TOPICAL
  Filled 2017-07-24: qty 56

## 2017-07-24 MED ORDER — TERBUTALINE SULFATE 1 MG/ML IJ SOLN
0.2500 mg | Freq: Once | INTRAMUSCULAR | Status: DC | PRN
  Filled 2017-07-24: qty 1

## 2017-07-24 MED ORDER — DIPHENHYDRAMINE HCL 50 MG/ML IJ SOLN: 12.5000 mg | INTRAMUSCULAR | Status: DC | PRN

## 2017-07-24 MED ORDER — WITCH HAZEL-GLYCERIN EX PADS: 1.0000 "application " | MEDICATED_PAD | CUTANEOUS | Status: DC | PRN

## 2017-07-24 MED ORDER — LACTATED RINGERS IV SOLN
500.0000 mL | Freq: Once | INTRAVENOUS | Status: AC
  Administered 2017-07-24: 500 mL via INTRAVENOUS

## 2017-07-24 NOTE — Progress Notes (Signed)
Called to bedside as patient has a known tooth abscess. Patient asking for increased pain medication due to this abscess. She was given a short prescription just prior to coming in for labor. States that she is to have her tooth abscess fixed on 5/10 at 1400. On exam tooth 3 via the universal numbering system is significantly tender to palpation. There does appear to be a noticeable crack extending close to the full length of the tooth. Given these exam findings will give  percocet q 6 hours until her discharge in the am.  Myrene Buddy MD PGY-1 [facility] Medicine Resident

## 2017-07-24 NOTE — Anesthesia Preprocedure Evaluation (Signed)
Anesthesia Evaluation  Patient identified by MRN, date of birth, ID band Patient awake    Reviewed: Allergy & Precautions, Patient's Chart, lab work & pertinent test results  Airway Mallampati: II  TM Distance: >3 FB Neck ROM: Full    Dental  (+) Teeth Intact   Pulmonary former smoker,    Pulmonary exam normal breath sounds clear to auscultation       Cardiovascular negative cardio ROS   Rhythm:Regular Rate:Normal     Neuro/Psych negative neurological ROS  negative psych ROS   GI/Hepatic negative GI ROS, Neg liver ROS,   Endo/Other  Obesity  Renal/GU negative Renal ROS  negative genitourinary   Musculoskeletal negative musculoskeletal ROS (+)   Abdominal (+) + obese,   Peds  Hematology negative hematology ROS (+) anemia ,   Anesthesia Other Findings   Reproductive/Obstetrics (+) Pregnancy                             Anesthesia Physical Anesthesia Plan  ASA: II  Anesthesia Plan: Epidural   Post-op Pain Management:    Induction:   PONV Risk Score and Plan:   Airway Management Planned: Natural Airway  Additional Equipment:   Intra-op Plan:   Post-operative Plan:   Informed Consent: I have reviewed the patients History and Physical, chart, labs and discussed the procedure including the risks, benefits and alternatives for the proposed anesthesia with the patient or authorized representative who has indicated his/her understanding and acceptance.     Plan Discussed with: Anesthesiologist  Anesthesia Plan Comments:         Anesthesia Quick Evaluation

## 2017-07-24 NOTE — Anesthesia Procedure Notes (Signed)
Epidural Patient location during procedure: OB Start time: 07/24/2017 12:29 AM  Staffing Anesthesiologist: Mal Amabile, MD Performed: anesthesiologist   Preanesthetic Checklist Completed: patient identified, site marked, surgical consent, pre-op evaluation, timeout performed, IV checked, risks and benefits discussed and monitors and equipment checked  Epidural Patient position: sitting Prep: site prepped and draped and DuraPrep Patient monitoring: continuous pulse ox and blood pressure Approach: midline Location: L4-L5 Injection technique: LOR air  Needle:  Needle type: Tuohy  Needle gauge: 17 G Needle length: 9 cm and 9 Needle insertion depth: 5 cm cm Catheter type: closed end flexible Catheter size: 19 Gauge Catheter at skin depth: 10 cm Test dose: negative and Other  Assessment Events: blood not aspirated, injection not painful, no injection resistance, negative IV test and no paresthesia  Additional Notes Patient identified. Risks and benefits discussed including failed block, incomplete  Pain control, post dural puncture headache, nerve damage, paralysis, blood pressure Changes, nausea, vomiting, reactions to medications-both toxic and allergic and post Partum back pain. All questions were answered. Patient expressed understanding and wished to proceed. Sterile technique was used throughout procedure. Epidural site was Dressed with sterile barrier dressing. No paresthesias, signs of intravascular injection Or signs of intrathecal spread were encountered.  Patient was more comfortable after the epidural was dosed. Please see RN's note for documentation of vital signs and FHR which are stable.

## 2017-07-24 NOTE — Anesthesia Postprocedure Evaluation (Signed)
Anesthesia Post Note  Patient: Robin Webster  Procedure(s) Performed: AN AD HOC LABOR EPIDURAL     Patient location during evaluation: Mother Baby Anesthesia Type: Epidural Level of consciousness: awake and alert Pain management: pain level controlled Vital Signs Assessment: post-procedure vital signs reviewed and stable Respiratory status: spontaneous breathing, nonlabored ventilation and respiratory function stable Cardiovascular status: stable Postop Assessment: no headache, no backache and epidural receding Anesthetic complications: no    Last Vitals:  Vitals:   07/24/17 0940 07/24/17 1040  BP: 121/62 103/61  Pulse: 75 79  Resp: 16 18  Temp: (!) 36.4 C 36.9 C  SpO2:  100%    Last Pain:  Vitals:   07/24/17 1040  TempSrc: Oral  PainSc: 9    Pain Goal: Patients Stated Pain Goal: 4 (07/24/17 0930)               Marrion Coy

## 2017-07-24 NOTE — Progress Notes (Signed)
Labor Progress Note  Demika Langenderfer is a 27 y.o. G2P1001 at [redacted]w[redacted]d  admitted for rupture of membranes  S: Comfortable now with epidural.   O:  BP 111/67   Pulse 80   Temp 98.5 F (36.9 C) (Oral)   Resp 18   Ht  (1.6 m)   Wt 88.2 kg (194 lb 8 oz)   LMP 10/18/2016 (Approximate) Comment: Irreg cycles  SpO2 100%   BMI 34.45 kg/m   No intake/output data recorded.  FHT:  FHR: 135 bpm, variability: moderate,  accelerations:  Present,  decelerations:  Absent UC:   regular, every 3-5 minutes SVE:   Dilation: 5 Effacement (%): 70 Station: 0 Exam by:: Pola Corn, RN SROM  Labs: Lab Results  Component Value Date   WBC 13.0 (H) 07/23/2017   HGB 11.2 (L) 07/23/2017   HCT 35.0 (L) 07/23/2017   MCV 88.6 07/23/2017   PLT 245 07/23/2017    Assessment / Plan: 27 y.o. G2P1001 [redacted]w[redacted]d in early labor Spontaneous labor, progressing normally  Labor: Progressing normally; will continue to to monitor progression. No augmentation at this time as patient is changing. Fetal Wellbeing:  Category I Pain Control:  Epidural Anticipated MOD:  NSVD  Expectant management   Caryl Ada, DO OB Fellow [facility] for [facility], [facility]

## 2017-07-25 ENCOUNTER — Encounter: Payer: Medicaid Other | Admitting: Obstetrics & Gynecology

## 2017-07-25 LAB — BIRTH TISSUE RECOVERY COLLECTION (PLACENTA DONATION)

## 2017-07-25 MED ORDER — IBUPROFEN 600 MG PO TABS: 600.0000 mg | ORAL_TABLET | Freq: Four times a day (QID) | ORAL | 0 refills | Status: DC

## 2017-07-25 NOTE — Discharge Summary (Signed)
OB Discharge Summary     Patient Name: Robin Webster DOB: [birth year] MRN: [id number]  Date of admission: 07/23/2017 Delivering MD: Pincus Large   Date of discharge: 07/25/2017  Admitting diagnosis: 39 WKS CTX Intrauterine pregnancy: [redacted]w[redacted]d     Secondary diagnosis:  Active Problems:   Labor and delivery, indication for care   SVD (spontaneous vaginal delivery)  Additional problems: none     Discharge diagnosis: Term Pregnancy Delivered                                                                                                Post partum procedures:none  Augmentation: n/a  Complications: None  Hospital course:  Onset of Labor With Vaginal Delivery     27 y.o. yo G2P2002 at [redacted]w[redacted]d was admitted in Active Labor on 07/23/2017. Patient had an uncomplicated labor course as follows:  Membrane Rupture Time/Date: 9:00 PM ,07/23/2017   Intrapartum Procedures: Episiotomy: None [1]                                         Lacerations:  1st degree [2];Periurethral [8]  Patient had a delivery of a Viable infant. 07/24/2017  Information for the patient's newborn:  Madalina, Rosman [[id number]]       Pateint had an uncomplicated postpartum course.  She is ambulating, tolerating a regular diet, passing flatus, and urinating well. Patient is discharged home in stable condition on 07/25/17.   Physical exam  Vitals:   07/24/17 1040 07/24/17 1440 07/24/17 2205 07/25/17 0527  BP: 103/61 113/70 114/70 124/80  Pulse: 79 79 78 83  Resp: 18 18    Temp: 98.4 F (36.9 C) 98.4 F (36.9 C) 98.2 F (36.8 C) 98.1 F (36.7 C)  TempSrc: Oral Oral Oral Oral  SpO2: 100% 100%    Weight:      Height:       General: alert, cooperative and no distress Lochia: appropriate Uterine Fundus: firm Incision: N/A DVT Evaluation: No evidence of DVT seen on physical exam. Labs: Lab Results  Component Value Date   WBC 13.0 (H) 07/23/2017   HGB 11.2 (L) 07/23/2017   HCT 35.0 (L) 07/23/2017   MCV 88.6  07/23/2017   PLT 245 07/23/2017   No flowsheet data found.  Discharge instruction: per After Visit Summary and "Baby and Me Booklet".  After visit meds:  Allergies as of 07/25/2017   No Known Allergies     Medication List    STOP taking these medications   oxyCODONE-acetaminophen 5-325 MG tablet Commonly known as:  PERCOCET/ROXICET     TAKE these medications   acetaminophen 500 MG tablet Commonly known as:  TYLENOL Take 1,000 mg by mouth every 6 (six) hours as needed for mild pain.   amoxicillin 875 MG tablet Commonly known as:  AMOXIL Take 1 tablet (875 mg total) by mouth 2 (two) times daily for 10 days.   CONCEPT OB 130-92.4-1 MG Caps Take 1 tablet daily by mouth.   ibuprofen 600  MG tablet Commonly known as:  ADVIL,MOTRIN Take 1 tablet (600 mg total) by mouth every 6 (six) hours.   pantoprazole 20 MG tablet Commonly known as:  PROTONIX Take 1 tablet (20 mg total) by mouth daily.       Diet: routine diet  Activity: Advance as tolerated. Pelvic rest for 6 weeks.   Outpatient follow up:6 weeks Follow up Appt: Future Appointments  Date Time Provider Department Center  08/25/2017  6:00 PM Pincus Large, DO [facility]   Follow up Visit:No follow-ups on file.  Postpartum contraception: Undecided and discussed LARCS as most effective, pt to f/u in office  Newborn Data: Live born female  Birth Weight: 7 lb 10.2 oz (3465 g) APGAR: 8, 8  Newborn Delivery   Birth date/time:  07/24/2017 08:00:00 Delivery type:  Vaginal, Spontaneous     Baby Feeding: Breast Disposition:home with mother   07/25/2017 Sharen Counter, CNM

## 2017-07-25 NOTE — Lactation Note (Addendum)
This note was copied from a baby's chart. Lactation Consultation Note  Patient Name: Girl Nanda Bittick ZOXWR'U Date: 07/25/2017 Reason for consult: Initial assessment;Term  37 hours old FT female who is being exclusively BF by her mother, she's a P2 and somehow experienced BF; she was able to BF her first child for  3 months. Baby was swaddled in bassinet when entering the room, but she was already cueing, offered assistance with latch, LC advised mom to take baby STS to the breast, and she did in cross cradle position; baby was able to latch right away to the right breast.  A couple of swallows were heard, mom had the TV on and it was hard to listen for swallows, but baby had a rhythmical sucking pattern with long movements of the jaws, mom also stated thAT colostrum was seen when she hand expressed earlier today. Both nipples looked intact upon examination with no signs of trauma. Per mom baby has already started cluster feeding tonight, discussed cluster feeding; baby still nursing when exiting the room.  Encouraged mom to feed baby STS 8-12 times/24 hours or sooner if feeding cues are present. Reviewed BF brochure, BF resources and feeding diary, mom is aware of LC services and will call PRN.  Maternal Data Formula Feeding for Exclusion: No Has patient been taught Hand Expression?: Yes Does the patient have breastfeeding experience prior to this delivery?: Yes  Feeding Feeding Type: Breast Fed Length of feed: 12 min(baby still nursing when exiting the room)  LATCH Score Latch: Grasps breast easily, tongue down, lips flanged, rhythmical sucking.  Audible Swallowing: A few with stimulation  Type of Nipple: Everted at rest and after stimulation  Comfort (Breast/Nipple): Soft / non-tender  Hold (Positioning): Assistance needed to correctly position infant at breast and maintain latch.  LATCH Score: 8  Interventions Interventions: Breast feeding basics reviewed;Assisted with  latch;Skin to skin;Adjust position;Position options;Breast compression  Lactation Tools Discussed/Used WIC Program: Yes   Consult Status Consult Status: Follow-up Date: 07/26/17 Follow-up type: In-patient    Codie Krogh Venetia Constable 07/25/2017, 2:39 AM

## 2017-07-25 NOTE — Lactation Note (Signed)
This note was copied from a baby's chart. Lactation Consultation Note  Patient Name: Robin Webster ZOXWR'U Date: 07/25/2017 Reason for consult: Follow-up assessment  Visited with P2 Mom of term baby, baby at 5% weight loss.  Photographer in room trying to get a picture, baby fussing and cueing.  Offered to assist with positioning and latch.  Mom sitting in chair, pillows added explaining importance of having good support.  Mom using cradle hold and struggling to latch baby using scissor hold on breast.  Assisted Mom with switching hands to cross cradle hold to attain a deeper latch onto breast.  Showed Mom how to sandwich breast.  Identified occasional swallows for Mom.  Encouraged alternate breast compression to increase milk transfer.  Talked about normal cluster feeding on 2nd day of life.  Encouraged keeping baby STS, and feeding at breast often.  Goal of 8-12 feedings per 24 hrs. Engorgement prevention and treatment discussed.   Mom aware of OP lactation support available, and encouraged to call prn.   Feeding Feeding Type: Breast Fed Length of feed: 17 min  LATCH Score Latch: Grasps breast easily, tongue down, lips flanged, rhythmical sucking.  Audible Swallowing: A few with stimulation  Type of Nipple: Everted at rest and after stimulation  Comfort (Breast/Nipple): Soft / non-tender  Hold (Positioning): Assistance needed to correctly position infant at breast and maintain latch.  LATCH Score: 8  Interventions Interventions: Breast feeding basics reviewed;Assisted with latch;Skin to skin;Breast massage;Hand express;Breast compression;Adjust position;Support pillows;Position options;Expressed milk   Consult Status Consult Status: Complete Date: 07/25/17 Follow-up type: Call as needed    Judee Clara 07/25/2017, 11:26 AM

## 2017-07-31 ENCOUNTER — Encounter: Payer: Medicaid Other | Admitting: Obstetrics & Gynecology

## 2017-08-25 ENCOUNTER — Ambulatory Visit (INDEPENDENT_AMBULATORY_CARE_PROVIDER_SITE_OTHER): Payer: Medicaid Other

## 2017-08-25 DIAGNOSIS — Z1389 Encounter for screening for other disorder: Secondary | ICD-10-CM | POA: Diagnosis not present

## 2017-08-25 MED ORDER — NORGESTIMATE-ETH ESTRADIOL 0.25-35 MG-MCG PO TABS: 1.0000 | ORAL_TABLET | Freq: Every day | ORAL | 11 refills | Status: DC

## 2017-08-25 NOTE — Patient Instructions (Signed)

## 2017-08-25 NOTE — Progress Notes (Signed)
Post Partum Exam  Robin Webster is a 27 y.o. 302P2002 female who presents for a postpartum visit. She is 5 weeks postpartum following a vaginal delivery. I have fully reviewed the prenatal and intrapartum course. The delivery was at 39 gestational weeks.  Anesthesia: epidural. Postpartum course has been normal. Baby's course has been normal. Baby is feeding by bottle (stopped breastfeeding 1 week ago). Bleeding none. Bowel function is normal. Bladder function is normal Patient is not sexually active. Contraception method is pills. Postpartum depression screening:neg  The following portions of the patient's history were reviewed and updated as appropriate: allergies, current medications, past family history, past medical history, past social history, past surgical history and problem list. Last pap smear done 01/2017 and was Normal  Review of Systems Pertinent items noted in HPI and remainder of comprehensive ROS otherwise negative.    Objective:  Last menstrual period 10/18/2016, unknown if currently breastfeeding.  General:  alert, cooperative and no distress   Breasts:  inspection negative, no nipple discharge or bleeding, no masses or nodularity palpable  Lungs: clear to auscultation bilaterally  Heart:  regular rate and rhythm, S1, S2 normal, no murmur, click, rub or gallop  Abdomen: soft, non-tender; bowel sounds normal; no masses,  no organomegaly   Vulva:  not evaluated  Vagina: not evaluated  Cervix:  not evaluated  Corpus: not examined  Adnexa:  not evaluated  Rectal Exam: Not performed.        Assessment:    Normal postpartum exam. Pap smear not done at today's visit.   Plan:   1. Contraception: OCP (estrogen/progesterone)  2. Follow up in: 1 year or as needed.   Rolm BookbinderCaroline M George Webster, CNM 08/25/17 6:54 PM

## 2018-04-24 ENCOUNTER — Ambulatory Visit: Payer: Medicaid Other

## 2018-04-24 ENCOUNTER — Ambulatory Visit
Admission: EM | Admit: 2018-04-24 | Discharge: 2018-04-24 | Disposition: A | Discharge status: Home / Self Care | Payer: Medicaid Other | Attending: Emergency Medicine | Admitting: Emergency Medicine

## 2018-04-24 ENCOUNTER — Encounter: Payer: Self-pay | Admitting: Emergency Medicine

## 2018-04-24 DIAGNOSIS — R0781 Pleurodynia: Secondary | ICD-10-CM

## 2018-04-24 DIAGNOSIS — R0789 Other chest pain: Secondary | ICD-10-CM

## 2018-04-24 DIAGNOSIS — M25511 Pain in right shoulder: Secondary | ICD-10-CM

## 2018-04-24 DIAGNOSIS — M545 Low back pain, unspecified: Secondary | ICD-10-CM

## 2018-04-24 MED ORDER — CYCLOBENZAPRINE HCL 5 MG PO TABS: 5.0000 mg | ORAL_TABLET | Freq: Two times a day (BID) | ORAL | 0 refills | Status: DC | PRN

## 2018-04-24 MED ORDER — NAPROXEN 500 MG PO TABS: 500.0000 mg | ORAL_TABLET | Freq: Two times a day (BID) | ORAL | 0 refills | Status: DC

## 2018-04-24 NOTE — ED Notes (Signed)
Patient able to ambulate independently  
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
## 2018-04-24 NOTE — Discharge Instructions (Signed)
Pain may worsen over next 24 hours, but then I would expect gradual improvement over next 2 weeks  Use anti-inflammatories for pain/swelling. You may take up to 800 mg Ibuprofen every 8 hours with food. You may supplement Ibuprofen with Tylenol [phone] mg every 8 hours.  OR Naprosyn twice daily with food  You may use flexeril as needed to help with pain. This is a muscle relaxer and causes sedation- please use only at bedtime or when you will be home and not have to drive/work- begin with 1 tablet, may increase to 2 if not causing significant drowsiness   Alternate ice and heat  Please follow up if symptoms worsening or not improving
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
## 2019-11-04 ENCOUNTER — Inpatient Hospital Stay (HOSPITAL_COMMUNITY)
Admission: AD | Admit: 2019-11-04 | Discharge: 2019-11-04 | Disposition: A | Discharge status: Home / Self Care | Payer: Medicaid Other | Attending: Obstetrics and Gynecology | Admitting: Obstetrics and Gynecology

## 2019-11-04 ENCOUNTER — Other Ambulatory Visit: Payer: Self-pay

## 2019-11-04 ENCOUNTER — Encounter (HOSPITAL_COMMUNITY): Payer: Self-pay | Admitting: Obstetrics and Gynecology

## 2019-11-04 DIAGNOSIS — N907 Vulvar cyst: Secondary | ICD-10-CM | POA: Diagnosis not present

## 2019-11-04 DIAGNOSIS — N898 Other specified noninflammatory disorders of vagina: Secondary | ICD-10-CM | POA: Diagnosis not present

## 2019-11-04 DIAGNOSIS — O26891 Other specified pregnancy related conditions, first trimester: Secondary | ICD-10-CM | POA: Insufficient documentation

## 2019-11-04 DIAGNOSIS — Z3A11 11 weeks gestation of pregnancy: Secondary | ICD-10-CM | POA: Insufficient documentation

## 2019-11-04 DIAGNOSIS — Z87891 Personal history of nicotine dependence: Secondary | ICD-10-CM | POA: Insufficient documentation

## 2019-11-04 DIAGNOSIS — Z791 Long term (current) use of non-steroidal anti-inflammatories (NSAID): Secondary | ICD-10-CM | POA: Diagnosis not present

## 2019-11-04 DIAGNOSIS — R102 Pelvic and perineal pain: Secondary | ICD-10-CM | POA: Diagnosis not present

## 2019-11-04 DIAGNOSIS — O99891 Other specified diseases and conditions complicating pregnancy: Secondary | ICD-10-CM | POA: Diagnosis not present

## 2019-11-04 LAB — URINALYSIS, ROUTINE W REFLEX MICROSCOPIC
Bacteria, UA: NONE SEEN
Bilirubin Urine: NEGATIVE
Glucose, UA: NEGATIVE mg/dL
Hgb urine dipstick: NEGATIVE
Ketones, ur: NEGATIVE mg/dL
Nitrite: NEGATIVE
Protein, ur: NEGATIVE mg/dL
Specific Gravity, Urine: 1.002 — ABNORMAL LOW (ref 1.005–1.030)
pH: 6 (ref 5.0–8.0)

## 2019-11-04 LAB — WET PREP, GENITAL
Sperm: NONE SEEN
Trich, Wet Prep: NONE SEEN
Yeast Wet Prep HPF POC: NONE SEEN

## 2019-11-04 LAB — POCT PREGNANCY, URINE: Preg Test, Ur: POSITIVE — AB

## 2019-11-04 NOTE — MAU Provider Note (Signed)
Chief Complaint: Vaginal Pain   First Provider Initiated Contact with Patient 11/04/19 1920      Pt has picture of vaginal wall cyst, on exam no cyst visible. Likely ruptured at home when pt wiped and saw blood. No bleeding noted today. Wet prep/GCC pending.   SUBJECTIVE HPI: Robin Webster is a 29 y.o. G3P2002 at [redacted]w[redacted]d by LMP who presents to maternity admissions reporting pain and something swollen in her vagina.  She brings pictures of a round smooth protrusion from her vagina and reports mild pain when touching it only.  She has not tried any treatments for pain.  Today when wiping there was bright red bleeding and what looked like pus so she came in for evaluation.  HPI  Past Medical History:  Diagnosis Date  . Vaginal Pap smear, abnormal    Past Surgical History:  Procedure Laterality Date  . NO PAST SURGERIES     Social History   Socioeconomic History  . Marital status: Single    Spouse name: Not on file  . Number of children: Not on file  . Years of education: Not on file  . Highest education level: Not on file  Occupational History  . Not on file  Tobacco Use  . Smoking status: Former Smoker    Types: Cigarettes    Quit date: 01/26/2017    Years since quitting: 2.7  . Smokeless tobacco: Never Used  Vaping Use  . Vaping Use: Never used  Substance and Sexual Activity  . Alcohol use: Not Currently    Comment: occasionally  . Drug use: Not Currently    Types: Marijuana    Comment: last used 2013   . Sexual activity: Yes    Birth control/protection: None, Pill  Other Topics Concern  . Not on file  Social History Narrative  . Not on file   Social Determinants of Health   Financial Resource Strain:   . Difficulty of Paying Living Expenses: Not on file  Food Insecurity:   . Worried About Programme researcher, broadcasting/film/video in the Last Year: Not on file  . Ran Out of Food in the Last Year: Not on file  Transportation Needs:   . Lack of Transportation (Medical): Not on file   . Lack of Transportation (Non-Medical): Not on file  Physical Activity:   . Days of Exercise per Week: Not on file  . Minutes of Exercise per Session: Not on file  Stress:   . Feeling of Stress : Not on file  Social Connections:   . Frequency of Communication with Friends and Family: Not on file  . Frequency of Social Gatherings with Friends and Family: Not on file  . Attends Religious Services: Not on file  . Active Member of Clubs or Organizations: Not on file  . Attends Banker Meetings: Not on file  . Marital Status: Not on file  Intimate Partner Violence:   . Fear of Current or Ex-Partner: Not on file  . Emotionally Abused: Not on file  . Physically Abused: Not on file  . Sexually Abused: Not on file   No current facility-administered medications on file prior to encounter.   Current Outpatient Medications on File Prior to Encounter  Medication Sig Dispense Refill  . Prenatal Vit-Fe Fumarate-FA (PRENATAL VITAMINS PO) Take by mouth.    Marland Kitchen acetaminophen (TYLENOL) 500 MG tablet Take 1,000 mg by mouth every 6 (six) hours as needed for mild pain.    . cyclobenzaprine (FLEXERIL) 5 MG tablet  Take 1-2 tablets (5-10 mg total) by mouth 2 (two) times daily as needed for muscle spasms. 30 tablet 0  . ibuprofen (ADVIL,MOTRIN) 600 MG tablet Take 1 tablet (600 mg total) by mouth every 6 (six) hours. (Patient not taking: Reported on 08/25/2017) 30 tablet 0  . naproxen (NAPROSYN) 500 MG tablet Take 1 tablet (500 mg total) by mouth 2 (two) times daily. 30 tablet 0   No Known Allergies  ROS:  Review of Systems   I have reviewed patient's Past Medical Hx, Surgical Hx, Family Hx, Social Hx, medications and allergies.   Physical Exam   Patient Vitals for the past 24 hrs:  BP Temp Pulse Resp SpO2 Height Weight  11/04/19 1752 121/77 98.3 F (36.8 C) 92 16 100 % 5\' 2"  (1.575 m) 71.7 kg   Constitutional: Well-developed, well-nourished female in no acute distress.  Cardiovascular:  normal rate Respiratory: normal effort GI: Abd soft, non-tender. Pos BS x 4 MS: Extremities nontender, no edema, normal ROM Neurologic: Alert and oriented x 4.  GU: Neg CVAT.  PELVIC EXAM: Cervix pink, visually closed, without lesion, scant white creamy discharge, vaginal walls and external genitalia normal Bimanual exam: Cervix 0/long/high, firm, anterior, neg CMT, uterus nontender, nonenlarged, adnexa without tenderness, enlargement, or mass  FHT wnl by doppler  LAB RESULTS Results for orders placed or performed during the hospital encounter of 11/04/19 (from the past 24 hour(s))  Pregnancy, urine POC     Status: Abnormal   Collection Time: 11/04/19  5:47 PM  Result Value Ref Range   Preg Test, Ur POSITIVE (A) NEGATIVE  Urinalysis, Routine w reflex microscopic     Status: Abnormal   Collection Time: 11/04/19  5:52 PM  Result Value Ref Range   Color, Urine STRAW (A) YELLOW   APPearance CLEAR CLEAR   Specific Gravity, Urine 1.002 (L) 1.005 - 1.030   pH 6.0 5.0 - 8.0   Glucose, UA NEGATIVE NEGATIVE mg/dL   Hgb urine dipstick NEGATIVE NEGATIVE   Bilirubin Urine NEGATIVE NEGATIVE   Ketones, ur NEGATIVE NEGATIVE mg/dL   Protein, ur NEGATIVE NEGATIVE mg/dL   Nitrite NEGATIVE NEGATIVE   Leukocytes,Ua SMALL (A) NEGATIVE   RBC / HPF 0-5 0 - 5 RBC/hpf   WBC, UA 6-10 0 - 5 WBC/hpf   Bacteria, UA NONE SEEN NONE SEEN   Squamous Epithelial / LPF 0-5 0 - 5       IMAGING No results found.  MAU Management/MDM: Orders Placed This Encounter  Procedures  . Urinalysis, Routine w reflex microscopic  . Pregnancy, urine POC    No orders of the defined types were placed in this encounter.   Pt picture on her phone shows pink round smooth protrusion at introitus, c/w vaginal wall cyst rather than bartholin's or other labial cyst.  On exam toayk, no cyst or other abnormality visible.  Small area with erythema on perineum on exam.  Likely a vaginal wall cyst ruptured today at home when pt saw  bleeding/discharge and is now not present.  Pregnancy wnl with normal FHT.  Pt to f/u in prenatal office as scheduled, return to MAU with any emergencies.    ASSESSMENT  1. Vaginal wall cyst   2. [redacted] weeks gestation of pregnancy    PLAN Discharge home Allergies as of 11/04/2019   No Known Allergies     Medication List    STOP taking these medications   ibuprofen 600 MG tablet Commonly known as: ADVIL   naproxen 500 MG tablet  Commonly known as: NAPROSYN     TAKE these medications   acetaminophen 500 MG tablet Commonly known as: TYLENOL Take 1,000 mg by mouth every 6 (six) hours as needed for mild pain.   cyclobenzaprine 5 MG tablet Commonly known as: FLEXERIL Take 1-2 tablets (5-10 mg total) by mouth 2 (two) times daily as needed for muscle spasms.   PRENATAL VITAMINS PO Take by mouth.        Sharen Counter Certified Nurse-Midwife 11/04/2019  7:21 PM

## 2019-11-04 NOTE — MAU Note (Signed)
. °  Robin Webster is a 29 y.o. at [redacted]w[redacted]d here in MAU reporting: she is having discomfort in her vagina. State that she feels like she has a ball (cyst) in her vaginal area. Denies any vaginal bleeding at present.  LMP: 08/15/19 Onset of complaint: couple of days Pain score: 1 Vitals:   11/04/19 1752  BP: 121/77  Pulse: 92  Resp: 16  Temp: 98.3 F (36.8 C)  SpO2: 100%     FHT:156 Lab orders placed from triage: UA

## 2019-11-04 NOTE — MAU Note (Signed)
Noted ? Bump inside vagina a couple days ago when wiping. No pain or pressure. Some spotting yesterday.

## 2019-11-05 ENCOUNTER — Telehealth: Payer: Self-pay | Admitting: Student

## 2019-11-05 DIAGNOSIS — A749 Chlamydial infection, unspecified: Secondary | ICD-10-CM

## 2019-11-05 LAB — GC/CHLAMYDIA PROBE AMP (~~LOC~~) NOT AT ARMC
Chlamydia: POSITIVE — AB
Comment: NEGATIVE
Comment: NORMAL
Neisseria Gonorrhea: NEGATIVE

## 2019-11-05 MED ORDER — AZITHROMYCIN 500 MG PO TABS: 1000.0000 mg | ORAL_TABLET | Freq: Once | ORAL | 0 refills | Status: AC

## 2019-11-05 NOTE — Telephone Encounter (Signed)
-----   Message from Kathe Becton, RN sent at 11/05/2019  3:01 PM EDT ----- This patient tested positive for :  chlamydia  She has " NKDA",I have informed the patient of her results and confirmed her pharmacy is correct in her chart. Please send Rx.   Thank you,   Kathe Becton, RN   Results faxed to [facility] Department.

## 2019-11-05 NOTE — Telephone Encounter (Addendum)
Robin Webster tested positive for  Chlamydia. Patient was called by RN and allergies and pharmacy confirmed. Rx sent to pharmacy of choice.   Robin Horn, NP 11/05/2019 3:03 PM        ----- Message from Robin Becton, RN sent at 11/05/2019  3:01 PM EDT ----- This patient tested positive for :  chlamydia  She has " NKDA",I have informed the patient of her results and confirmed her pharmacy is correct in her chart. Please send Rx.   Thank you,   Robin Becton, RN   Results faxed to [facility].

## 2019-11-10 ENCOUNTER — Ambulatory Visit (INDEPENDENT_AMBULATORY_CARE_PROVIDER_SITE_OTHER): Payer: Medicaid Other

## 2019-11-10 DIAGNOSIS — Z3490 Encounter for supervision of normal pregnancy, unspecified, unspecified trimester: Secondary | ICD-10-CM | POA: Insufficient documentation

## 2019-11-10 MED ORDER — BLOOD PRESSURE KIT DEVI: 1.0000 | 0 refills | Status: AC

## 2019-11-10 NOTE — Progress Notes (Addendum)
I connected with  Robin Webster at work on 11/10/19 by a video enabled telemedicine application and verified that I am speaking with the correct person using two identifiers.   I discussed the limitations of evaluation and management by telemedicine. The patient expressed understanding and agreed to proceed.   PRENATAL INTAKE SUMMARY  Ms. Robin Webster presents today New OB Nurse Interview.  OB History    Gravida  3   Para  2   Term  2   Preterm      AB      Living  2     SAB      TAB      Ectopic      Multiple  0   Live Births  2        Obstetric Comments  1- [place] 2-[facility]       I have reviewed the patient's medical, obstetrical, social, and family histories, medications, and available lab results.  SUBJECTIVE She has no unusual complaints  OBJECTIVE Initial NOB NURSE INTAKE (New OB)  GENERAL APPEARANCE: oriented to person, place and time    ASSESSMENT Normal pregnancy  PLAN Prenatal care PNL will be done at NOB visit PHQ-9=2 Patient will pick up BP Cuff and take to NOB appt for instructions on proper usage. Babyscripts downloaded

## 2019-11-17 ENCOUNTER — Other Ambulatory Visit (HOSPITAL_COMMUNITY)
Admission: RE | Admit: 2019-11-17 | Discharge: 2019-11-17 | Disposition: A | Discharge status: Home / Self Care | Payer: Medicaid Other | Source: Ambulatory Visit | Attending: Women's Health | Admitting: Women's Health

## 2019-11-17 ENCOUNTER — Ambulatory Visit (INDEPENDENT_AMBULATORY_CARE_PROVIDER_SITE_OTHER): Payer: Medicaid Other | Admitting: Women's Health

## 2019-11-17 ENCOUNTER — Encounter: Payer: Self-pay | Admitting: Women's Health

## 2019-11-17 ENCOUNTER — Other Ambulatory Visit: Payer: Self-pay

## 2019-11-17 VITALS — BP 133/89 | HR 116 | Wt 153.2 lb

## 2019-11-17 DIAGNOSIS — Z3A13 13 weeks gestation of pregnancy: Secondary | ICD-10-CM

## 2019-11-17 DIAGNOSIS — Z3481 Encounter for supervision of other normal pregnancy, first trimester: Secondary | ICD-10-CM | POA: Diagnosis not present

## 2019-11-17 DIAGNOSIS — Z3482 Encounter for supervision of other normal pregnancy, second trimester: Secondary | ICD-10-CM

## 2019-11-17 MED ORDER — ASPIRIN EC 81 MG PO TBEC: 81.0000 mg | DELAYED_RELEASE_TABLET | Freq: Every day | ORAL | 11 refills | Status: DC

## 2019-11-17 NOTE — Patient Instructions (Signed)
Maternity Assessment Unit (MAU)  The Maternity Assessment Unit (MAU) is located at the [facility] at [facility]. The address is: [address] [place], [place], [place] [address]. Please see map below for additional directions.    The Maternity Assessment Unit is designed to help you during your pregnancy, and for up to 6 weeks after delivery, with any pregnancy- or postpartum-related emergencies, if you think you are in labor, or if your water has broken. For example, if you experience nausea and vomiting, vaginal bleeding, severe abdominal or pelvic pain, elevated blood pressure or other problems related to your pregnancy or postpartum time, please come to the Maternity Assessment Unit for assistance.        Second Trimester of Pregnancy The second trimester is from week 14 through week 27 (months 4 through 6). The second trimester is often a time when you feel your best. Your body has adjusted to being pregnant, and you begin to feel better physically. Usually, morning sickness has lessened or quit completely, you may have more energy, and you may have an increase in appetite. The second trimester is also a time when the fetus is growing rapidly. At the end of the sixth month, the fetus is about 9 inches long and weighs about 1 pounds. You will likely begin to feel the baby move (quickening) between 16 and 20 weeks of pregnancy. Body changes during your second trimester Your body continues to go through many changes during your second trimester. The changes vary from woman to woman.  Your weight will continue to increase. You will notice your lower abdomen bulging out.  You may begin to get stretch marks on your hips, abdomen, and breasts.  You may develop headaches that can be relieved by medicines. The medicines should be approved by your health care provider.  You may urinate more often because the fetus is pressing on your bladder.  You may  develop or continue to have heartburn as a result of your pregnancy.  You may develop constipation because certain hormones are causing the muscles that push waste through your intestines to slow down.  You may develop hemorrhoids or swollen, bulging veins (varicose veins).  You may have back pain. This is caused by: ? Weight gain. ? Pregnancy hormones that are relaxing the joints in your pelvis. ? A shift in weight and the muscles that support your balance.  Your breasts will continue to grow and they will continue to become tender.  Your gums may bleed and may be sensitive to brushing and flossing.  Dark spots or blotches (chloasma, mask of pregnancy) may develop on your face. This will likely fade after the baby is born.  A dark line from your belly button to the pubic area (linea nigra) may appear. This will likely fade after the baby is born.  You may have changes in your hair. These can include thickening of your hair, rapid growth, and changes in texture. Some women also have hair loss during or after pregnancy, or hair that feels dry or thin. Your hair will most likely return to normal after your baby is born. What to expect at prenatal visits During a routine prenatal visit:  You will be weighed to make sure you and the fetus are growing normally.  Your blood pressure will be taken.  Your abdomen will be measured to track your baby's growth.  The fetal heartbeat will be listened to.  Any test results from the previous visit  will be discussed. Your health care provider may ask you:  How you are feeling.  If you are feeling the baby move.  If you have had any abnormal symptoms, such as leaking fluid, bleeding, severe headaches, or abdominal cramping.  If you are using any tobacco products, including cigarettes, chewing tobacco, and electronic cigarettes.  If you have any questions. Other tests that may be performed during your second trimester include:  Blood tests  that check for: ? Low iron levels (anemia). ? High blood sugar that affects pregnant women (gestational diabetes) between 67 and 28 weeks. ? Rh antibodies. This is to check for a protein on red blood cells (Rh factor).  Urine tests to check for infections, diabetes, or protein in the urine.  An ultrasound to confirm the proper growth and development of the baby.  An amniocentesis to check for possible genetic problems.  Fetal screens for spina bifida and Down syndrome.  HIV (human immunodeficiency virus) testing. Routine prenatal testing includes screening for HIV, unless you choose not to have this test. Follow these instructions at home: Medicines  Follow your health care provider's instructions regarding medicine use. Specific medicines may be either safe or unsafe to take during pregnancy.  Take a prenatal vitamin that contains at least 600 micrograms (mcg) of folic acid.  If you develop constipation, try taking a stool softener if your health care provider approves. Eating and drinking   Eat a balanced diet that includes fresh fruits and vegetables, whole grains, good sources of protein such as meat, eggs, or tofu, and low-fat dairy. Your health care provider will help you determine the amount of weight gain that is right for you.  Avoid raw meat and uncooked cheese. These carry germs that can cause birth defects in the baby.  If you have low calcium intake from food, talk to your health care provider about whether you should take a daily calcium supplement.  Limit foods that are high in fat and processed sugars, such as fried and sweet foods.  To prevent constipation: ? Drink enough fluid to keep your urine clear or pale yellow. ? Eat foods that are high in fiber, such as fresh fruits and vegetables, whole grains, and beans. Activity  Exercise only as directed by your health care provider. Most women can continue their usual exercise routine during pregnancy. Try to  exercise for 30 minutes at least 5 days a week. Stop exercising if you experience uterine contractions.  Avoid heavy lifting, wear low heel shoes, and practice good posture.  A sexual relationship may be continued unless your health care provider directs you otherwise. Relieving pain and discomfort  Wear a good support bra to prevent discomfort from breast tenderness.  Take warm sitz baths to soothe any pain or discomfort caused by hemorrhoids. Use hemorrhoid cream if your health care provider approves.  Rest with your legs elevated if you have leg cramps or low back pain.  If you develop varicose veins, wear support hose. Elevate your feet for 15 minutes, 3-4 times a day. Limit salt in your diet. Prenatal Care  Write down your questions. Take them to your prenatal visits.  Keep all your prenatal visits as told by your health care provider. This is important. Safety  Wear your seat belt at all times when driving.  Make a list of emergency phone numbers, including numbers for family, friends, the hospital, and police and fire departments. General instructions  Ask your health care provider for a referral to a  local prenatal education class. Begin classes no later than the beginning of month 6 of your pregnancy.  Ask for help if you have counseling or nutritional needs during pregnancy. Your health care provider can offer advice or refer you to specialists for help with various needs.  Do not use hot tubs, steam rooms, or saunas.  Do not douche or use tampons or scented sanitary pads.  Do not cross your legs for long periods of time.  Avoid cat litter boxes and soil used by cats. These carry germs that can cause birth defects in the baby and possibly loss of the fetus by miscarriage or stillbirth.  Avoid all smoking, herbs, alcohol, and unprescribed drugs. Chemicals in these products can affect the formation and growth of the baby.  Do not use any products that contain nicotine  or tobacco, such as cigarettes and e-cigarettes. If you need help quitting, ask your health care provider.  Visit your dentist if you have not gone yet during your pregnancy. Use a soft toothbrush to brush your teeth and be gentle when you floss. Contact a health care provider if:  You have dizziness.  You have mild pelvic cramps, pelvic pressure, or nagging pain in the abdominal area.  You have persistent nausea, vomiting, or diarrhea.  You have a bad smelling vaginal discharge.  You have pain when you urinate. Get help right away if:  You have a fever.  You are leaking fluid from your vagina.  You have spotting or bleeding from your vagina.  You have severe abdominal cramping or pain.  You have rapid weight gain or weight loss.  You have shortness of breath with chest pain.  You notice sudden or extreme swelling of your face, hands, ankles, feet, or legs.  You have not felt your baby move in over an hour.  You have severe headaches that do not go away when you take medicine.  You have vision changes. Summary  The second trimester is from week 14 through week 27 (months 4 through 6). It is also a time when the fetus is growing rapidly.  Your body goes through many changes during pregnancy. The changes vary from woman to woman.  Avoid all smoking, herbs, alcohol, and unprescribed drugs. These chemicals affect the formation and growth your baby.  Do not use any tobacco products, such as cigarettes, chewing tobacco, and e-cigarettes. If you need help quitting, ask your health care provider.  Contact your health care provider if you have any questions. Keep all prenatal visits as told by your health care provider. This is important. This information is not intended to replace advice given to you by your health care provider. Make sure you discuss any questions you have with your health care provider. Document Revised: 06/26/2018 Document Reviewed: 04/09/2016 Elsevier  Patient Education  2020 [employer] Inc.        Round Ligament Pain  The round ligament is a cord of muscle and tissue that helps support the uterus. It can become a source of pain during pregnancy if it becomes stretched or twisted as the baby grows. The pain usually begins in the second trimester (13-28 weeks) of pregnancy, and it can come and go until the baby is delivered. It is not a serious problem, and it does not cause harm to the baby. Round ligament pain is usually a short, sharp, and pinching pain, but it can also be a dull, lingering, and aching pain. The pain is felt in the lower side of  the abdomen or in the groin. It usually starts deep in the groin and moves up to the outside of the hip area. The pain may occur when you:  Suddenly change position, such as quickly going from a sitting to standing position.  Roll over in bed.  Cough or sneeze.  Do physical activity. Follow these instructions at home:   Watch your condition for any changes.  When the pain starts, relax. Then try any of these methods to help with the pain: ? Sitting down. ? Flexing your knees up to your abdomen. ? Lying on your side with one pillow under your abdomen and another pillow between your legs. ? Sitting in a warm bath for 15-20 minutes or until the pain goes away.  Take over-the-counter and prescription medicines only as told by your health care provider.  Move slowly when you sit down or stand up.  Avoid long walks if they cause pain.  Stop or reduce your physical activities if they cause pain.  Keep all follow-up visits as told by your health care provider. This is important. Contact a health care provider if:  Your pain does not go away with treatment.  You feel pain in your back that you did not have before.  Your medicine is not helping. Get help right away if:  You have a fever or chills.  You develop uterine contractions.  You have vaginal bleeding.  You have nausea  or vomiting.  You have diarrhea.  You have pain when you urinate. Summary  Round ligament pain is felt in the lower abdomen or groin. It is usually a short, sharp, and pinching pain. It can also be a dull, lingering, and aching pain.  This pain usually begins in the second trimester (13-28 weeks). It occurs because the uterus is stretching with the growing baby, and it is not harmful to the baby.  You may notice the pain when you suddenly change position, when you cough or sneeze, or during physical activity.  Relaxing, flexing your knees to your abdomen, lying on one side, or taking a warm bath may help to get rid of the pain.  Get help from your health care provider if the pain does not go away or if you have vaginal bleeding, nausea, vomiting, diarrhea, or painful urination. This information is not intended to replace advice given to you by your health care provider. Make sure you discuss any questions you have with your health care provider. Document Revised: 08/20/2017 Document Reviewed: 08/20/2017 Elsevier Patient Education  2020 [employer].

## 2019-11-17 NOTE — Addendum Note (Signed)
Addended by: Kennon Portela on: 11/17/2019 02:55 PM   Modules accepted: Orders

## 2019-11-17 NOTE — Progress Notes (Signed)
kk  History:   Robin Webster is a 29 y.o. G3P2002 at 26w3dby LMP being seen today for her first obstetrical visit.  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 Patient does intend to breast feed. Pregnancy history fully reviewed.   Pt reports this is a desired and planned pregnancy. Allergies: NKDA Current Medications: PNVs PMH: No HTN, DM, asthma. PSH: none OB Hx: full term NSVD Social Hx: pt does not smoke, drink, or use drugs. Family Hx: none   Patient reports no complaints.      HISTORY: OB History  Gravida Para Term Preterm AB Living  '3 2 2 ' 0 0 2  SAB TAB Ectopic Multiple Live Births  0 0 0 0 2    # Outcome Date GA Lbr Len/2nd Weight Sex Delivery Anes PTL Lv  3 Current           2 Term 07/24/17 35w2d0:15 / 02:15 7 lb 10.2 oz (3.465 kg) F Vag-Spont EPI  LIV     Name: Robin Webster   Apgar1: 8  Apgar5: 8  1 Term 06/25/05 384w6d lb 3 oz (3.26 kg) F Vag-Spont None  LIV     Birth Comments: no complications    Obstetric Comments  1- [place]  2-[facility]    Last pap smear was done 01/2017 and was normal  Past Medical History:  Diagnosis Date  . Supervision of other normal pregnancy, antepartum 01/22/2017    Clinic [facility] Prenatal Labs Dating 14 weeks (Irreg LMP) Blood type: O/Positive/-- (11/07 1152)  Genetic Screen  Quad  negative Antibody:Negative (11/07 1152) Anatomic US [place] female Rubella: 1.74 (11/07 1152) GTT Early:               Third trimester: Results for Robin Webster [id number] of 05/14/2017 13:14   Ref. Range 05/09/2017 09:49 Glucose, 1 hour Latest Ref Range: 65 - 179 mg/dL 130   . Vaginal Pap smear, abnormal    Past Surgical History:  Procedure Laterality Date  . NO PAST SURGERIES     Family History  Problem Relation Age of Onset  . Hypertension Father    Social History   Tobacco Use  . Smoking status: Former Smoker    Types: Cigarettes    Quit date: 01/26/2017    Years since quitting: 2.8  .  Smokeless tobacco: Never Used  Vaping Use  . Vaping Use: Never used  Substance Use Topics  . Alcohol use: Not Currently    Comment: occasionally  . Drug use: Not Currently    Types: Marijuana    Comment: last used 2013    No Known Allergies Current Outpatient Medications on File Prior to Visit  Medication Sig Dispense Refill  . Prenatal Vit-Fe Fumarate-FA (PRENATAL VITAMINS PO) Take by mouth.    . aMarland Kitchenetaminophen (TYLENOL) 500 MG tablet Take 1,000 mg by mouth every 6 (six) hours as needed for mild pain. (Patient not taking: Reported on 11/10/2019)    . Blood Pressure Monitoring (BLOOD PRESSURE KIT) DEVI 1 kit by Does not apply route once a week. Check Blood Pressure regularly and record readings into the Babyscripts App.  Large Cuff.  DX O90.0 1 each 0  . cyclobenzaprine (FLEXERIL) 5 MG tablet Take 1-2 tablets (5-10 mg total) by mouth 2 (two) times daily as needed for muscle spasms. (Patient not taking: Reported on 11/10/2019) 30 tablet 0   No current facility-administered medications on file prior to visit.  Review of Systems Pertinent items noted in HPI and remainder of comprehensive ROS otherwise negative. Physical Exam:   Vitals:   11/17/19 1344  BP: 133/89  Pulse: (!) 116  Weight: 153 lb 3.2 oz (69.5 kg)   Fetal Heart Rate (bpm): 160  Uterus:   c/w dates  Pelvic Exam: Perineum: no hemorrhoids, normal perineum   Vulva: normal external genitalia, no lesions   Vagina:  normal mucosa, normal discharge   Cervix: no lesions and normal, pap smear done.    Adnexa: normal adnexa and no mass, fullness, tenderness   Bony Pelvis: average  System: General: well-developed, well-nourished female in no acute distress   Breasts:  normal appearance, no masses or tenderness bilaterally   Skin: normal coloration and turgor, no rashes   Neurologic: oriented, normal, negative, normal mood   Extremities: normal strength, tone, and muscle mass, ROM of all joints is normal   HEENT PERRLA,  extraocular movement intact and sclera clear, anicteric   Mouth/Teeth mucous membranes moist, pharynx normal without lesions and dental hygiene good   Neck supple and no masses   Cardiovascular: regular rate and rhythm   Respiratory:  no respiratory distress, normal breath sounds   Abdomen: soft, non-tender; bowel sounds normal; no masses,  no organomegaly    Assessment:    Pregnancy: [id number] Patient Active Problem List   Diagnosis Date Noted  . Supervision of normal pregnancy 11/10/2019  . 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 Encounter for supervision of other normal pregnancy in first trimester - Urine Culture - Cervicovaginal ancillary only( Robin Webster) - Cytology - PAP( Robin Webster) - CBC/D/Plt+RPR+Rh+ABO+Rub Ab... - HgB A1c - [place] MFM OB COMP + 14 WK; Future - Comp Met (CMET) - Protein / creatinine ratio, urine - aspirin EC 81 MG tablet; Take 1 tablet (81 mg total) by mouth daily. Swallow whole.  Dispense: 30 tablet; Refill: 11   Initial labs drawn. Continue prenatal vitamins. Problem list reviewed and updated. Genetic Screening discussed, NIPS: requested. Ultrasound discussed; fetal anatomic survey: ordered. Anticipatory guidance about prenatal visits given including labs, ultrasounds, and testing. Discussed usage of Babyscripts and virtual visits as additional source of managing and completing prenatal visits in midst of coronavirus and pandemic.   Encouraged to complete MyChart Registration for her ability to review results, send requests, and have questions addressed.  The nature of [facility] with multiple MDs and Advanced Practice Providers was explained to patient; also emphasized that residents, students are part of our team. Routine obstetric precautions reviewed. Encouraged to seek out care at office or emergency room [facility] preferred) for urgent and/or emergent  concerns. Return in about 4 weeks (around 12/15/2019) for in-person/LOB/APP OK/AFP, needs anatomy US scheduled.    Clarisa Fling, NP  2:53 PM 11/17/2019

## 2019-11-18 LAB — CYTOLOGY - PAP
Comment: NEGATIVE
Diagnosis: NEGATIVE
High risk HPV: NEGATIVE

## 2019-11-18 LAB — COMPREHENSIVE METABOLIC PANEL
ALT: 18 IU/L (ref 0–32)
AST: 19 IU/L (ref 0–40)
Albumin/Globulin Ratio: 1.3 (ref 1.2–2.2)
Albumin: 3.9 g/dL (ref 3.8–4.8)
Alkaline Phosphatase: 137 IU/L — ABNORMAL HIGH (ref 48–121)
BUN/Creatinine Ratio: 16 (ref 9–23)
BUN: 6 mg/dL (ref 6–20)
Bilirubin Total: 0.2 mg/dL (ref 0.0–1.2)
CO2: 20 mmol/L (ref 20–29)
Calcium: 9.3 mg/dL (ref 8.7–10.2)
Chloride: 105 mmol/L (ref 96–106)
Creatinine, Ser: 0.37 mg/dL — ABNORMAL LOW (ref 0.57–1.00)
GFR calc Af Amer: 165 mL/min/{1.73_m2} (ref >59)
GFR calc non Af Amer: 143 mL/min/{1.73_m2} (ref >59)
Globulin, Total: 2.9 g/dL (ref 1.5–4.5)
Glucose: 103 mg/dL — ABNORMAL HIGH (ref 65–99)
Potassium: 4.3 mmol/L (ref 3.5–5.2)
Sodium: 137 mmol/L (ref 134–144)
Total Protein: 6.8 g/dL (ref 6.0–8.5)

## 2019-11-18 LAB — CBC/D/PLT+RPR+RH+ABO+RUB AB...
Antibody Screen: NEGATIVE
Basophils Absolute: 0 10*3/uL (ref 0.0–0.2)
Basos: 0 %
EOS (ABSOLUTE): 0 10*3/uL (ref 0.0–0.4)
Eos: 0 %
HCV Ab: <0.1 s/co ratio (ref 0.0–0.9)
HIV Screen 4th Generation wRfx: NONREACTIVE
Hematocrit: 37.1 % (ref 34.0–46.6)
Hemoglobin: 12.2 g/dL (ref 11.1–15.9)
Hepatitis B Surface Ag: NEGATIVE
Immature Grans (Abs): 0.1 10*3/uL (ref 0.0–0.1)
Immature Granulocytes: 1 %
Lymphocytes Absolute: 2 10*3/uL (ref 0.7–3.1)
Lymphs: 22 %
MCH: 28.8 pg (ref 26.6–33.0)
MCHC: 32.9 g/dL (ref 31.5–35.7)
MCV: 88 fL (ref 79–97)
Monocytes Absolute: 0.5 10*3/uL (ref 0.1–0.9)
Monocytes: 5 %
Neutrophils Absolute: 6.3 10*3/uL (ref 1.4–7.0)
Neutrophils: 72 %
Platelets: 286 10*3/uL (ref 150–450)
RBC: 4.23 x10E6/uL (ref 3.77–5.28)
RDW: 12.8 % (ref 11.7–15.4)
RPR Ser Ql: NONREACTIVE
Rh Factor: POSITIVE
Rubella Antibodies, IGG: 1.97 index (ref >0.99)
WBC: 8.8 10*3/uL (ref 3.4–10.8)

## 2019-11-18 LAB — HCV INTERPRETATION

## 2019-11-18 LAB — PROTEIN / CREATININE RATIO, URINE
Creatinine, Urine: 54.6 mg/dL
Protein, Ur: 8.4 mg/dL
Protein/Creat Ratio: 154 mg/g creat (ref 0–200)

## 2019-11-18 LAB — HEMOGLOBIN A1C
Est. average glucose Bld gHb Est-mCnc: 117 mg/dL
Hgb A1c MFr Bld: 5.7 % — ABNORMAL HIGH (ref 4.8–5.6)

## 2019-11-19 LAB — CERVICOVAGINAL ANCILLARY ONLY
Chlamydia: POSITIVE — AB
Comment: NEGATIVE
Comment: NEGATIVE
Comment: NORMAL
Neisseria Gonorrhea: NEGATIVE
Trichomonas: NEGATIVE

## 2019-11-19 LAB — URINE CULTURE

## 2019-11-23 ENCOUNTER — Encounter: Payer: Self-pay | Admitting: Women's Health

## 2019-11-24 ENCOUNTER — Encounter: Payer: Self-pay | Admitting: Women's Health

## 2019-11-24 ENCOUNTER — Other Ambulatory Visit: Payer: Self-pay | Admitting: Women's Health

## 2019-11-24 ENCOUNTER — Telehealth: Payer: Self-pay

## 2019-11-24 DIAGNOSIS — A749 Chlamydial infection, unspecified: Secondary | ICD-10-CM

## 2019-11-24 MED ORDER — AZITHROMYCIN 500 MG PO TABS: 1000.0000 mg | ORAL_TABLET | Freq: Once | ORAL | 0 refills | Status: AC

## 2019-11-24 NOTE — Telephone Encounter (Signed)
TC to pt regarding +CT results and need for early GTT pt voiced understanding. Made aware Rx has been sent and to refrain from any unprotected intercourse and partner needs to get treatment at PCP or Health Dept.  Forms Faxed to [facility].  Pt voiced understanding.

## 2019-11-24 NOTE — Progress Notes (Signed)
Patient tested positive for CT. Patient informed via MyChart and treatment sent. Please fill out appropriate paperwork for reporting. Thank you, Joni Reining

## 2019-11-24 NOTE — Progress Notes (Signed)
RX azithromycin for CT treatment.  Marylen Ponto, NP  10:57 AM 11/24/2019

## 2019-11-27 ENCOUNTER — Other Ambulatory Visit: Payer: Self-pay

## 2019-11-27 ENCOUNTER — Inpatient Hospital Stay (HOSPITAL_COMMUNITY)
Admission: AD | Admit: 2019-11-27 | Discharge: 2019-11-27 | Disposition: A | Discharge status: Home / Self Care | Payer: Medicaid Other | Attending: Obstetrics and Gynecology | Admitting: Obstetrics and Gynecology

## 2019-11-27 DIAGNOSIS — Z960 Presence of urogenital implants: Secondary | ICD-10-CM | POA: Diagnosis not present

## 2019-11-27 DIAGNOSIS — Z3A14 14 weeks gestation of pregnancy: Secondary | ICD-10-CM | POA: Diagnosis not present

## 2019-11-27 DIAGNOSIS — Z79899 Other long term (current) drug therapy: Secondary | ICD-10-CM | POA: Insufficient documentation

## 2019-11-27 DIAGNOSIS — O26892 Other specified pregnancy related conditions, second trimester: Secondary | ICD-10-CM | POA: Insufficient documentation

## 2019-11-27 DIAGNOSIS — Z87891 Personal history of nicotine dependence: Secondary | ICD-10-CM | POA: Diagnosis not present

## 2019-11-27 DIAGNOSIS — O34512 Maternal care for incarceration of gravid uterus, second trimester: Secondary | ICD-10-CM | POA: Diagnosis not present

## 2019-11-27 DIAGNOSIS — R339 Retention of urine, unspecified: Secondary | ICD-10-CM | POA: Insufficient documentation

## 2019-11-27 DIAGNOSIS — Z7982 Long term (current) use of aspirin: Secondary | ICD-10-CM | POA: Diagnosis not present

## 2019-11-27 NOTE — MAU Provider Note (Signed)
History     CSN: [id number]  Arrival date and time: 11/27/19 0849   First Provider Initiated Contact with Patient 11/27/19 0955      Chief Complaint  Patient presents with  . Urinary Retention   HPI Robin Webster is a 29 y.o. G3P2002 at 51w6dwho presents with urinary retention. She states for the last 12 hours, she has been unable to urinate and is now having 9/10 pain in her pelvis. She denies any leaking or bleeding. Reports feeling some flutters. She states this happened at the same gestation with her last pregnancy as well. She reports needs a catheter for about 2 weeks.   OB History    Gravida  3   Para  2   Term  2   Preterm      AB      Living  2     SAB      TAB      Ectopic      Multiple  0   Live Births  2        Obstetric Comments  1- [place] 2-[facility]        Past Medical History:  Diagnosis Date  . Supervision of other normal pregnancy, antepartum 01/22/2017    Clinic [facility] Prenatal Labs Dating 14 weeks ([facility] LMP) Blood type: O/Positive/-- (11/07 1152)  Genetic Screen  Quad  negative Antibody:Negative (11/07 1152) Anatomic [place]Nml female Rubella: 1.74 (11/07 1152) GTT Early:               Third trimester: Results for JSTEPHANE, JUNKINS(MRN [id number] as of 05/14/2017 13:14   Ref. Range 05/09/2017 09:49 Glucose, 1 hour Latest Ref Range: 65 - 179 mg/dL 130   . Vaginal Pap smear, abnormal     Past Surgical History:  Procedure Laterality Date  . NO PAST SURGERIES      Family History  Problem Relation Age of Onset  . Hypertension Father     Social History   Tobacco Use  . Smoking status: Former Smoker    Types: Cigarettes    Quit date: 01/26/2017    Years since quitting: 2.8  . Smokeless tobacco: Never Used  Vaping Use  . Vaping Use: Never used  Substance Use Topics  . Alcohol use: Not Currently    Comment: occasionally  . Drug use: Not Currently    Types: Marijuana    Comment: last used 2013     Allergies: No Known  Allergies  Medications Prior to Admission  Medication Sig Dispense Refill Last Dose  . acetaminophen (TYLENOL) 500 MG tablet Take 1,000 mg by mouth every 6 (six) hours as needed for mild pain. (Patient not taking: Reported on 11/10/2019)     . aspirin EC 81 MG tablet Take 1 tablet (81 mg total) by mouth daily. Swallow whole. 30 tablet 11   . Blood Pressure Monitoring (BLOOD PRESSURE KIT) DEVI 1 kit by Does not apply route once a week. Check Blood Pressure regularly and record readings into the [employer] App.  Large Cuff.  DX O90.0 1 each 0   . cyclobenzaprine (FLEXERIL) 5 MG tablet Take 1-2 tablets (5-10 mg total) by mouth 2 (two) times daily as needed for muscle spasms. (Patient not taking: Reported on 11/10/2019) 30 tablet 0   . Prenatal Vit-Fe Fumarate-FA (PRENATAL VITAMINS PO) Take by mouth.       Review of Systems  Constitutional: Negative.  Negative for fatigue and fever.  HENT: Negative.   Respiratory: Negative.  Negative for shortness of breath.   Cardiovascular: Negative.  Negative for chest pain.  Gastrointestinal: Positive for abdominal pain. Negative for constipation, diarrhea, nausea and vomiting.  Genitourinary: Positive for difficulty urinating and pelvic pain. Negative for dysuria, vaginal bleeding and vaginal discharge.  Neurological: Negative.  Negative for dizziness and headaches.   Physical Exam   Blood pressure 105/69, pulse 75, temperature 98.3 F (36.8 C), temperature source Oral, resp. rate 16, last menstrual period 08/15/2019, unknown if currently breastfeeding.  Physical Exam Vitals and nursing note reviewed.  Constitutional:      General: She is not in acute distress.    Appearance: She is well-developed.  HENT:     Head: Normocephalic.  Eyes:     Pupils: Pupils are equal, round, and reactive to light.  Cardiovascular:     Rate and Rhythm: Normal rate and regular rhythm.     Heart sounds: Normal heart sounds.  Pulmonary:     Effort: Pulmonary effort is  normal. No respiratory distress.     Breath sounds: Normal breath sounds.  Abdominal:     General: Bowel sounds are normal. There is no distension.     Palpations: Abdomen is soft.     Tenderness: There is no abdominal tenderness.  Genitourinary:    Comments: Cervix very anterior, closed, thick. Fullness of bladder palpated Skin:    General: Skin is warm and dry.  Neurological:     Mental Status: She is alert and oriented to person, place, and time.  Psychiatric:        Behavior: Behavior normal.        Thought Content: Thought content normal.        Judgment: Judgment normal.    FHT: 148 bpm  MAU Course  Procedures  MDM Bladder scan- >1016m urine in bladder Indwelling foley catheter placed with good output  Discussed follow up in the office early this week to reassess. Patient agreeable to plan of care. Dr. AHarolyn Rutherfordmade aware and agrees with plan of care.   Assessment and Plan   1. 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 Urinary retention   3. [redacted] weeks gestation of pregnancy    -Discharge home in stable condition -UTI precautions discussed -Patient advised to follow-up with OB this week, urgent message sent to [facility] to schedule with MD as soon as possible -Patient may return to MAU as needed or if her condition were to change or worsen   CWende MottCNM 11/27/2019, 9:55 AM

## 2019-11-27 NOTE — Discharge Instructions (Signed)
Indwelling Urinary Catheter Care, Adult An indwelling urinary catheter is a thin, flexible, germ-free (sterile) tube that is placed into the bladder to help drain urine out of the body. The catheter is inserted into the part of the body that drains urine from the bladder (urethra). Urine drains from the catheter into a drainage bag outside of the body. Taking good care of your catheter will keep it working properly and help to prevent problems from developing. What are the risks?  Bacteria may get into your bladder and cause a urinary tract infection.  Urine flow can become blocked. This can happen if the catheter is not working correctly, or if you have sediment or a blood clot in your bladder or the catheter.  Tissue near the catheter may become irritated and bleed. How to wear your catheter and your drainage bag Supplies needed  Adhesive tape or a leg strap.  Alcohol wipe or soap and water (if you use tape).  A clean towel (if you use tape).  Overnight drainage bag.  Smaller drainage bag (leg bag). Wearing your catheter and bag Use adhesive tape or a leg strap to attach your catheter to your leg.  Make sure the catheter is not pulled tight.  If a leg strap gets wet, replace it with a dry one.  If you use adhesive tape: 1. Use an alcohol wipe or soap and water to wash off any stickiness on your skin where you had tape before. 2. Use a clean towel to pat-dry the area. 3. Apply the new tape. You should have received a large overnight drainage bag and a smaller leg bag that fits underneath clothing.  You may wear the overnight bag at any time, but you should not wear the leg bag at night.  Always wear the leg bag below your knee.  Make sure the overnight drainage bag is always lower than the level of your bladder, but do not let it touch the floor. Before you go to sleep, hang the bag inside a wastebasket that is covered by a clean plastic bag. How to care for your skin around  the catheter     Supplies needed  A clean washcloth.  Water and mild soap.  A clean towel. Caring for your skin and catheter  Every day, use a clean washcloth and soapy water to clean the skin around your catheter. 1. Wash your hands with soap and water. 2. Wet a washcloth in warm water and mild soap. 3. Clean the skin around your urethra.  If you are female:  Use one hand to gently spread the folds of skin around your vagina (labia).  With the washcloth in your other hand, wipe the inner side of your labia on each side. Do this in a front-to-back direction.  If you are female:  Use one hand to pull back any skin that covers the end of your penis (foreskin).  With the washcloth in your other hand, wipe your penis in small circles. Start wiping at the tip of your penis, then move outward from the catheter.  Move the foreskin back in place, if this applies. 4. With your free hand, hold the catheter close to where it enters your body. Keep holding the catheter during cleaning so it does not get pulled out. 5. Use your other hand to clean the catheter with the washcloth.  Only wipe downward on the catheter.  Do not wipe upward toward your body, because that may push bacteria into your urethra   and cause infection. 6. Use a clean towel to pat-dry the catheter and the skin around it. Make sure to wipe off all soap. 7. Wash your hands with soap and water.  Shower every day. Do not take baths.  Do not use cream, ointment, or lotion on the area where the catheter enters your body, unless your health care provider tells you to do that.  Do not use powders, sprays, or lotions on your genital area.  Check your skin around the catheter every day for signs of infection. Check for: ? Redness, swelling, or pain. ? Fluid or blood. ? Warmth. ? Pus or a bad smell. How to empty the drainage bag Supplies needed  Rubbing alcohol.  Gauze pad or cotton ball.  Adhesive tape or a leg  strap. Emptying the bag Empty your drainage bag (your overnight drainage bag or your leg bag) when it is ?- full, or at least 2-3 times a day. Clean the drainage bag according to the manufacturer's instructions or as told by your health care provider. 1. Wash your hands with soap and water. 2. Detach the drainage bag from your leg. 3. Hold the drainage bag over the toilet or a clean container. Make sure the drainage bag is lower than your hips and bladder. This stops urine from going back into the tubing and into your bladder. 4. Open the pour spout at the bottom of the bag. 5. Empty the urine into the toilet or container. Do not let the pour spout touch any surface. This precaution is important to prevent bacteria from getting in the bag and causing infection. 6. Apply rubbing alcohol to a gauze pad or cotton ball. 7. Use the gauze pad or cotton ball to clean the pour spout. 8. Close the pour spout. 9. Attach the bag to your leg with adhesive tape or a leg strap. 10. Wash your hands with soap and water. How to change the drainage bag Supplies needed:  Alcohol wipes.  A clean drainage bag.  Adhesive tape or a leg strap. Changing the bag Replace your drainage bag with a clean bag if it leaks, starts to smell bad, or looks dirty. 1. Wash your hands with soap and water. 2. Detach the dirty drainage bag from your leg. 3. Pinch the catheter with your fingers so that urine does not spill out. 4. Disconnect the catheter tube from the drainage tube at the connection valve. Do not let the tubes touch any surface. 5. Clean the end of the catheter tube with an alcohol wipe. Use a different alcohol wipe to clean the end of the drainage tube. 6. Connect the catheter tube to the drainage tube of the clean bag. 7. Attach the clean bag to your leg with adhesive tape or a leg strap. Avoid attaching the new bag too tightly. 8. Wash your hands with soap and water. General instructions   Never pull on  your catheter or try to remove it. Pulling can damage your internal tissues.  Always wash your hands before and after you handle your catheter or drainage bag. Use a mild, fragrance-free soap. If soap and water are not available, use hand sanitizer.  Always make sure there are no twists or bends (kinks) in the catheter tube.  Always make sure there are no leaks in the catheter or drainage bag.  Drink enough fluid to keep your urine pale yellow.  Do not take baths, swim, or use a hot tub.  If you are female, wipe from   front to back after having a bowel movement. Contact a health care provider if:  Your urine is cloudy.  Your urine smells unusually bad.  Your catheter gets clogged.  Your catheter starts to leak.  Your bladder feels full. Get help right away if:  You have redness, swelling, or pain where the catheter enters your body.  You have fluid, blood, pus, or a bad smell coming from the area where the catheter enters your body.  The area where the catheter enters your body feels warm to the touch.  You have a fever.  You have pain in your abdomen, legs, lower back, or bladder.  You see blood in the catheter.  Your urine is pink or red.  You have nausea, vomiting, or chills.  Your urine is not draining into the bag.  Your catheter gets pulled out. Summary  An indwelling urinary catheter is a thin, flexible, germ-free (sterile) tube that is placed into the bladder to help drain urine out of the body.  The catheter is inserted into the part of the body that drains urine from the bladder (urethra).  Take good care of your catheter to keep it working properly and help prevent problems from developing.  Always wash your hands before and after you handle your catheter or drainage bag.  Never pull on your catheter or try to remove it. This information is not intended to replace advice given to you by your health care provider. Make sure you discuss any questions  you have with your health care provider. Document Revised: 06/26/2018 Document Reviewed: 10/18/2016 Elsevier Patient Education  2020 [employer] Inc[employer]  

## 2019-11-27 NOTE — MAU Note (Signed)
Pt reports to mau with c/o not being able to urinate for over 12 hours.  Pt states with last pregnancy, she had to have a foley cath for 2 weeks around the same gestation. Pt reporting pain 9/10. Denies vag bleeding or dc

## 2019-11-29 ENCOUNTER — Encounter: Payer: Self-pay | Admitting: Women's Health

## 2019-12-02 ENCOUNTER — Other Ambulatory Visit: Payer: Self-pay

## 2019-12-02 ENCOUNTER — Encounter: Payer: Self-pay | Admitting: Obstetrics

## 2019-12-02 ENCOUNTER — Ambulatory Visit (INDEPENDENT_AMBULATORY_CARE_PROVIDER_SITE_OTHER): Payer: Medicaid Other | Admitting: Family Medicine

## 2019-12-02 VITALS — BP 115/76 | HR 79 | Wt 157.0 lb

## 2019-12-02 DIAGNOSIS — O34512 Maternal care for incarceration of gravid uterus, second trimester: Secondary | ICD-10-CM

## 2019-12-02 DIAGNOSIS — Z3490 Encounter for supervision of normal pregnancy, unspecified, unspecified trimester: Secondary | ICD-10-CM

## 2019-12-02 NOTE — Progress Notes (Signed)
   PRENATAL VISIT NOTE  Subjective:  Robin Webster is a 29 y.o. G3P2002 at [redacted]w[redacted]d being seen today for ongoing prenatal care.  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  Patient reports Bladder irritation.  The patient has a history of incarcerated uterus with her previous pregnancy, again with this pregnancy requiring Foley placement for urinary drainage.  Last time this was required for a few days.  Her current catheter has been in for approximately 6 days.  She notes that she is leaking some urine around the outside of the catheter now.  Her urine continues to be clear.  Contractions: Not present. Vag. Bleeding: None.   . Denies leaking of fluid.   The following portions of the patient's history were reviewed and updated as appropriate: allergies, current medications, past family history, past medical history, past social history, past surgical history and problem list.   Objective:   Vitals:   12/02/19 1553  BP: 115/76  Pulse: 79  Weight: 157 lb (71.2 kg)    Fetal Status: Fetal Heart Rate (bpm): 140         General:  Alert, oriented and cooperative. Patient is in no acute distress.  Skin: Skin is warm and dry. No rash noted.   Cardiovascular: Normal heart rate noted  Respiratory: Normal respiratory effort, no problems with respiration noted  Abdomen: Soft, gravid, appropriate for gestational age.  Pain/Pressure: Absent     Pelvic: Cervical exam performed in the presence of a chaperone        Extremities: Normal range of motion.  Edema: None  Mental Status: Normal mood and affect. Normal behavior. Normal judgment and thought content.   Assessment and Plan:  Pregnancy: G3P2002 at [redacted]w[redacted]d 1. Encounter for supervision of normal pregnancy, antepartum, unspecified gravidity AFP  today, has anatomy scheduled - AFP, Serum, Open Spina Bifida   2. 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  Foley catheter removed intact. Patient is instructed if she is unable to void to return to MAU for replacement of her catheter.   General obstetric precautions including but not limited to vaginal bleeding, contractions, leaking of fluid and fetal movement were reviewed in detail with the patient. Please refer to After Visit Summary for other counseling recommendations.   No follow-ups on file.  Future Appointments  Date Time Provider Department Center  12/08/2019  8:00 AM [facility] None  12/15/2019  3:00 PM Rasch, Harolyn Rutherford, NP [facility] None  12/29/2019  1:45 PM [facility]    Reva Bores, MD

## 2019-12-07 LAB — AFP, SERUM, OPEN SPINA BIFIDA
AFP MoM: 1.05
AFP Value: 33.1 ng/mL
Gest. Age on Collection Date: 15.6 weeks
Maternal Age At EDD: 32.1 yr
OSBR Risk 1 IN: 10000
Test Results:: NEGATIVE
Weight: 157 [lb_av]

## 2019-12-08 ENCOUNTER — Encounter: Payer: Self-pay | Admitting: Obstetrics

## 2019-12-08 ENCOUNTER — Other Ambulatory Visit: Payer: Medicaid Other

## 2019-12-08 ENCOUNTER — Other Ambulatory Visit: Payer: Self-pay

## 2019-12-08 DIAGNOSIS — Z3490 Encounter for supervision of normal pregnancy, unspecified, unspecified trimester: Secondary | ICD-10-CM

## 2019-12-09 LAB — GLUCOSE TOLERANCE, 2 HOURS W/ 1HR
Glucose, 1 hour: 76 mg/dL (ref 65–179)
Glucose, 2 hour: 49 mg/dL — ABNORMAL LOW (ref 65–152)
Glucose, Fasting: 88 mg/dL (ref 65–91)

## 2019-12-15 ENCOUNTER — Encounter: Payer: Self-pay | Admitting: Obstetrics

## 2019-12-15 ENCOUNTER — Other Ambulatory Visit: Payer: Self-pay

## 2019-12-15 ENCOUNTER — Ambulatory Visit (INDEPENDENT_AMBULATORY_CARE_PROVIDER_SITE_OTHER): Payer: Medicaid Other | Admitting: Obstetrics and Gynecology

## 2019-12-15 VITALS — BP 116/71 | HR 86 | Wt 156.8 lb

## 2019-12-15 DIAGNOSIS — Z3490 Encounter for supervision of normal pregnancy, unspecified, unspecified trimester: Secondary | ICD-10-CM

## 2019-12-15 DIAGNOSIS — A749 Chlamydial infection, unspecified: Secondary | ICD-10-CM

## 2019-12-15 DIAGNOSIS — Z3481 Encounter for supervision of other normal pregnancy, first trimester: Secondary | ICD-10-CM

## 2019-12-15 DIAGNOSIS — O98812 Other maternal infectious and parasitic diseases complicating pregnancy, second trimester: Secondary | ICD-10-CM

## 2019-12-15 MED ORDER — ASPIRIN EC 81 MG PO TBEC: 81.0000 mg | DELAYED_RELEASE_TABLET | Freq: Every day | ORAL | 11 refills | Status: DC

## 2019-12-15 MED ORDER — AZITHROMYCIN 500 MG PO TABS: 1000.0000 mg | ORAL_TABLET | Freq: Once | ORAL | 0 refills | Status: AC

## 2019-12-16 NOTE — Progress Notes (Signed)
   PRENATAL VISIT NOTE  Subjective:  Robin Webster is a 29 y.o. G3P2002 at [redacted]w[redacted]d being seen today for ongoing prenatal care.  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  Patient reports no complaints.  Contractions: Not present. Vag. Bleeding: None.  Movement: Present. Denies leaking of fluid.   The following portions of the patient's history were reviewed and updated as appropriate: allergies, current medications, past family history, past medical history, past social history, past surgical history and problem list.   Objective:   Vitals:   12/15/19 1517  BP: 116/71  Pulse: 86  Weight: 156 lb 12.8 oz (71.1 kg)    Fetal Status: Fetal Heart Rate (bpm): 154   Movement: Present     General:  Alert, oriented and cooperative. Patient is in no acute distress.  Skin: Skin is warm and dry. No rash noted.   Cardiovascular: Normal heart rate noted  Respiratory: Normal respiratory effort, no problems with respiration noted  Abdomen: Soft, gravid, appropriate for gestational age.  Pain/Pressure: Absent     Pelvic: Cervical exam deferred        Extremities: Normal range of motion.  Edema: None  Mental Status: Normal mood and affect. Normal behavior. Normal judgment and thought content.   Assessment and Plan:  Pregnancy: G3P2002 at [redacted]w[redacted]d 1. Chlamydia infection affecting pregnancy in second trimester  Was not treated, did not see my chart message. Rx: Azithromycin with expedited partner therapy Rx.   2. Encounter for supervision of normal pregnancy, antepartum, unspecified gravidity  - AFP, Serum, Open Spina Bifida  3. Encounter for supervision of other normal pregnancy in first trimester  - aspirin EC 81 MG tablet; Take 1 tablet (81 mg total) by mouth daily. Swallow whole.   Dispense: 30 tablet; Refill: 11 Elevated A1c: Hx of GDM, Early 2 hour normal. Continue BASA   Preterm labor symptoms and general obstetric precautions including but not limited to vaginal bleeding, contractions, leaking of fluid and fetal movement were reviewed in detail with the patient. Please refer to After Visit Summary for other counseling recommendations.   No follow-ups on file.  Future Appointments  Date Time Provider Department Center  12/29/2019  1:45 PM [facility]  01/12/2020  2:00 PM [place], Skipper Cliche, MD [facility] None    Venia Carbon, NP

## 2019-12-17 LAB — AFP, SERUM, OPEN SPINA BIFIDA
AFP MoM: 1.38
AFP Value: 54.8 ng/mL
Gest. Age on Collection Date: 17.4 weeks
Maternal Age At EDD: 32.1 yr
OSBR Risk 1 IN: 3810
Test Results:: NEGATIVE
Weight: 156 [lb_av]

## 2019-12-29 ENCOUNTER — Other Ambulatory Visit: Payer: Self-pay

## 2019-12-29 ENCOUNTER — Ambulatory Visit: Payer: Medicaid Other | Attending: Women's Health

## 2019-12-29 DIAGNOSIS — Z3481 Encounter for supervision of other normal pregnancy, first trimester: Secondary | ICD-10-CM | POA: Diagnosis not present

## 2019-12-29 NOTE — Progress Notes (Unsigned)
3

## 2020-01-12 ENCOUNTER — Ambulatory Visit (INDEPENDENT_AMBULATORY_CARE_PROVIDER_SITE_OTHER): Payer: Medicaid Other | Admitting: Obstetrics and Gynecology

## 2020-01-12 ENCOUNTER — Other Ambulatory Visit (HOSPITAL_COMMUNITY)
Admission: RE | Admit: 2020-01-12 | Discharge: 2020-01-12 | Disposition: A | Discharge status: Home / Self Care | Payer: Medicaid Other | Source: Ambulatory Visit | Attending: Obstetrics and Gynecology | Admitting: Obstetrics and Gynecology

## 2020-01-12 ENCOUNTER — Encounter: Payer: Self-pay | Admitting: Obstetrics and Gynecology

## 2020-01-12 ENCOUNTER — Other Ambulatory Visit: Payer: Self-pay

## 2020-01-12 VITALS — BP 106/63 | HR 70 | Wt 164.7 lb

## 2020-01-12 DIAGNOSIS — O98812 Other maternal infectious and parasitic diseases complicating pregnancy, second trimester: Secondary | ICD-10-CM | POA: Diagnosis present

## 2020-01-12 DIAGNOSIS — Z3492 Encounter for supervision of normal pregnancy, unspecified, second trimester: Secondary | ICD-10-CM

## 2020-01-12 DIAGNOSIS — A749 Chlamydial infection, unspecified: Secondary | ICD-10-CM | POA: Insufficient documentation

## 2020-01-12 DIAGNOSIS — Z3A21 21 weeks gestation of pregnancy: Secondary | ICD-10-CM

## 2020-01-12 NOTE — Progress Notes (Signed)
Pt is here for ROB, [redacted]w[redacted]d.   TOC today.

## 2020-01-12 NOTE — Patient Instructions (Signed)

## 2020-01-12 NOTE — Progress Notes (Signed)
   PRENATAL VISIT NOTE  Subjective:  Robin Webster is a 29 y.o. G3P2002 at [redacted]w[redacted]d being seen today for ongoing prenatal care.  She is currently monitored for the following issues for this low-risk pregnancy and has Supervision of normal pregnancy; Chlamydia infection affecting pregnancy in second trimester; and [redacted] weeks gestation of pregnancy on their problem list.  Patient reports no complaints.  Contractions: Not present. Vag. Bleeding: None.  Movement: Present. Denies leaking of fluid.   The following portions of the patient's history were reviewed and updated as appropriate: allergies, current medications, past family history, past medical history, past social history, past surgical history and problem list.   Objective:   Vitals:   01/12/20 1407  BP: 106/63  Pulse: 70  Weight: 164 lb 11.2 oz (74.7 kg)    Fetal Status: Fetal Heart Rate (bpm): 150   Movement: Present     General:  Alert, oriented and cooperative. Patient is in no acute distress.  Skin: Skin is warm and dry. No rash noted.   Cardiovascular: Normal heart rate noted  Respiratory: Normal respiratory effort, no problems with respiration noted  Abdomen: Soft, gravid, appropriate for gestational age.  Pain/Pressure: Absent     Pelvic: Cervical exam deferred        Extremities: Normal range of motion.  Edema: None  Mental Status: Normal mood and affect. Normal behavior. Normal judgment and thought content.   Assessment and Plan:  Pregnancy: G3P2002 at [redacted]w[redacted]d 1. Encounter for supervision of normal pregnancy in second trimester, unspecified gravidity, [redacted] weeks gestation of pregnancy: No concerns or red flag symptoms today. Pt endorses +fetal movement and +FHTs today. Pt reports good adherence with prenatal vitamins and baby aspirin. Normal anatomy scan at [redacted]w[redacted]d with EFW 27%. - pt declined flu vaccine today - continue prenatal vitamin & baby ASA - plan for f/u prenatal appt in 4 weeks (plan for glucola given A1c 5.7% in  first trimester, plan to re-address birth control & flu vaccine given pt still undecided)  2. Chlamydia infection affecting pregnancy in second trimester: Pt prescribed treatment on 11/24/19. Today pt reports that she and partner were treated. No recurrent symptoms. Swab for TOC today. - Cervicovaginal ancillary only  Preterm labor symptoms and general obstetric precautions including but not limited to vaginal bleeding, contractions, leaking of fluid and fetal movement were reviewed in detail with the patient. Please refer to After Visit Summary for other counseling recommendations.   Return in about 4 weeks (around 02/09/2020) for in person f/u prenatal, any provider + early 2hr glucola.  Future Appointments  Date Time Provider Department Center  01/25/2020  8:00 AM [facility] None  01/25/2020  9:00 AM Brock Bad, MD [facility] None    Barb Merino Skipper Cliche, MD [facility] 01/12/2020 2:43 PM

## 2020-01-13 LAB — CERVICOVAGINAL ANCILLARY ONLY
Bacterial Vaginitis (gardnerella): POSITIVE — AB
Candida Glabrata: NEGATIVE
Candida Vaginitis: NEGATIVE
Chlamydia: NEGATIVE
Comment: NEGATIVE
Comment: NEGATIVE
Comment: NEGATIVE
Comment: NEGATIVE
Comment: NEGATIVE
Comment: NORMAL
Neisseria Gonorrhea: NEGATIVE
Trichomonas: NEGATIVE

## 2020-01-25 ENCOUNTER — Ambulatory Visit (INDEPENDENT_AMBULATORY_CARE_PROVIDER_SITE_OTHER): Payer: Medicaid Other | Admitting: Obstetrics

## 2020-01-25 ENCOUNTER — Other Ambulatory Visit: Payer: Self-pay

## 2020-01-25 ENCOUNTER — Encounter: Payer: Self-pay | Admitting: Obstetrics

## 2020-01-25 ENCOUNTER — Other Ambulatory Visit: Payer: Medicaid Other

## 2020-01-25 VITALS — BP 118/67 | HR 86 | Wt 165.0 lb

## 2020-01-25 DIAGNOSIS — Z3492 Encounter for supervision of normal pregnancy, unspecified, second trimester: Secondary | ICD-10-CM

## 2020-01-25 NOTE — Progress Notes (Signed)
Subjective:  Robin Webster is a 29 y.o. G3P2002 at [redacted]w[redacted]d being seen today for ongoing prenatal care.  She is currently monitored for the following issues for this low-risk pregnancy and has Supervision of normal pregnancy; Chlamydia infection affecting pregnancy in second trimester; and [redacted] weeks gestation of pregnancy on their problem list.  Patient reports no complaints.  Contractions: Not present. Vag. Bleeding: None.  Movement: Present. Denies leaking of fluid.   The following portions of the patient's history were reviewed and updated as appropriate: allergies, current medications, past family history, past medical history, past social history, past surgical history and problem list. Problem list updated.  Objective:   Vitals:   01/25/20 0848  BP: 118/67  Pulse: 86  Weight: 165 lb (74.8 kg)    Fetal Status:     Movement: Present     General:  Alert, oriented and cooperative. Patient is in no acute distress.  Skin: Skin is warm and dry. No rash noted.   Cardiovascular: Normal heart rate noted  Respiratory: Normal respiratory effort, no problems with respiration noted  Abdomen: Soft, gravid, appropriate for gestational age. Pain/Pressure: Absent     Pelvic:  Cervical exam deferred        Extremities: Normal range of motion.     Mental Status: Normal mood and affect. Normal behavior. Normal judgment and thought content.   Urinalysis:      Assessment and Plan:  Pregnancy: G3P2002 at [redacted]w[redacted]d  1. Encounter for supervision of normal pregnancy in second trimester, unspecified gravidity Rx: - Glucose Tolerance, 2 Hours w/1 Hour  Preterm labor symptoms and general obstetric precautions including but not limited to vaginal bleeding, contractions, leaking of fluid and fetal movement were reviewed in detail with the patient. Please refer to After Visit Summary for other counseling recommendations.   Return in about 4 weeks (around 02/22/2020) for ROB.   Brock Bad, MD   01/25/20

## 2020-01-26 LAB — GLUCOSE TOLERANCE, 2 HOURS W/ 1HR
Glucose, 1 hour: 108 mg/dL (ref 65–179)
Glucose, 2 hour: 92 mg/dL (ref 65–152)
Glucose, Fasting: 81 mg/dL (ref 65–91)

## 2020-02-22 ENCOUNTER — Telehealth (INDEPENDENT_AMBULATORY_CARE_PROVIDER_SITE_OTHER): Payer: Medicaid Other | Admitting: Advanced Practice Midwife

## 2020-02-22 DIAGNOSIS — Z3A27 27 weeks gestation of pregnancy: Secondary | ICD-10-CM

## 2020-02-22 DIAGNOSIS — O98812 Other maternal infectious and parasitic diseases complicating pregnancy, second trimester: Secondary | ICD-10-CM

## 2020-02-22 DIAGNOSIS — Z3483 Encounter for supervision of other normal pregnancy, third trimester: Secondary | ICD-10-CM

## 2020-02-22 DIAGNOSIS — A749 Chlamydial infection, unspecified: Secondary | ICD-10-CM

## 2020-02-22 NOTE — Progress Notes (Signed)
I connected with Robin Webster on 02/22/20 at  3:00 PM EST by telephone and verified that I am speaking with the correct person using two identifiers.  Pt is at work and does not have the BP cuff with her.  No complaints today per pt Normal 2 gtt on 01/25/20

## 2020-02-22 NOTE — Patient Instructions (Signed)
Contraception Choices Contraception, also called birth control, refers to methods or devices that prevent pregnancy. Hormonal methods Contraceptive implant  A contraceptive implant is a thin, plastic tube that contains a hormone. It is inserted into the upper part of the arm. It can remain in place for up to 3 years. Progestin-only injections Progestin-only injections are injections of progestin, a synthetic form of the hormone progesterone. They are given every 3 months by a health care provider. Birth control pills  Birth control pills are pills that contain hormones that prevent pregnancy. They must be taken once a day, preferably at the same time each day. Birth control patch  The birth control patch contains hormones that prevent pregnancy. It is placed on the skin and must be changed once a week for three weeks and removed on the fourth week. A prescription is needed to use this method of contraception. Vaginal ring  A vaginal ring contains hormones that prevent pregnancy. It is placed in the vagina for three weeks and removed on the fourth week. After that, the process is repeated with a new ring. A prescription is needed to use this method of contraception. Emergency contraceptive Emergency contraceptives prevent pregnancy after unprotected sex. They come in pill form and can be taken up to 5 days after sex. They work best the sooner they are taken after having sex. Most emergency contraceptives are available without a prescription. This method should not be used as your only form of birth control. Barrier methods Female condom  A female condom is a thin sheath that is worn over the penis during sex. Condoms keep sperm from going inside a woman's body. They can be used with a spermicide to increase their effectiveness. They should be disposed after a single use. Female condom  A female condom is a soft, loose-fitting sheath that is put into the vagina before sex. The condom keeps sperm  from going inside a woman's body. They should be disposed after a single use. Diaphragm  A diaphragm is a soft, dome-shaped barrier. It is inserted into the vagina before sex, along with a spermicide. The diaphragm blocks sperm from entering the uterus, and the spermicide kills sperm. A diaphragm should be left in the vagina for 6-8 hours after sex and removed within 24 hours. A diaphragm is prescribed and fitted by a health care provider. A diaphragm should be replaced every 1-2 years, after giving birth, after gaining more than 15 lb (6.8 kg), and after pelvic surgery. Cervical cap  A cervical cap is a round, soft latex or plastic cup that fits over the cervix. It is inserted into the vagina before sex, along with spermicide. It blocks sperm from entering the uterus. The cap should be left in place for 6-8 hours after sex and removed within 48 hours. A cervical cap must be prescribed and fitted by a health care provider. It should be replaced every 2 years. Sponge  A sponge is a soft, circular piece of polyurethane foam with spermicide on it. The sponge helps block sperm from entering the uterus, and the spermicide kills sperm. To use it, you make it wet and then insert it into the vagina. It should be inserted before sex, left in for at least 6 hours after sex, and removed and thrown away within 30 hours. Spermicides Spermicides are chemicals that kill or block sperm from entering the cervix and uterus. They can come as a cream, jelly, suppository, foam, or tablet. A spermicide should be inserted into the   vagina with an applicator at least 10-15 minutes before sex to allow time for it to work. The process must be repeated every time you have sex. Spermicides do not require a prescription. Intrauterine contraception Intrauterine device (IUD) An IUD is a T-shaped device that is put in a woman's uterus. There are two types:  Hormone IUD.This type contains progestin, a synthetic form of the hormone  progesterone. This type can stay in place for 3-5 years.  Copper IUD.This type is wrapped in copper wire. It can stay in place for 10 years.  Permanent methods of contraception Female tubal ligation In this method, a woman's fallopian tubes are sealed, tied, or blocked during surgery to prevent eggs from traveling to the uterus. Hysteroscopic sterilization In this method, a small, flexible insert is placed into each fallopian tube. The inserts cause scar tissue to form in the fallopian tubes and block them, so sperm cannot reach an egg. The procedure takes about 3 months to be effective. Another form of birth control must be used during those 3 months. Female sterilization This is a procedure to tie off the tubes that carry sperm (vasectomy). After the procedure, the man can still ejaculate fluid (semen). Natural planning methods Natural family planning In this method, a couple does not have sex on days when the woman could become pregnant. Calendar method This means keeping track of the length of each menstrual cycle, identifying the days when pregnancy can happen, and not having sex on those days. Ovulation method In this method, a couple avoids sex during ovulation. Symptothermal method This method involves not having sex during ovulation. The woman typically checks for ovulation by watching changes in her temperature and in the consistency of cervical mucus. Post-ovulation method In this method, a couple waits to have sex until after ovulation. Summary  Contraception, also called birth control, means methods or devices that prevent pregnancy.  Hormonal methods of contraception include implants, injections, pills, patches, vaginal rings, and emergency contraceptives.  Barrier methods of contraception can include female condoms, female condoms, diaphragms, cervical caps, sponges, and spermicides.  There are two types of IUDs (intrauterine devices). An IUD can be put in a woman's uterus to  prevent pregnancy for 3-5 years.  Permanent sterilization can be done through a procedure for males, females, or both.  Natural family planning methods involve not having sex on days when the woman could become pregnant. This information is not intended to replace advice given to you by your health care provider. Make sure you discuss any questions you have with your health care provider. Document Revised: 03/06/2017 Document Reviewed: 04/06/2016 Elsevier Patient Education  2020 [employer] Inc[employer]  

## 2020-02-22 NOTE — Progress Notes (Signed)
   TELEHEALTH OBSTETRICS VISIT ENCOUNTER NOTE  I connected with Robin Webster on 02/22/20 at  3:00 PM EST by MyChart Video at home and verified that I am speaking with the correct person using two identifiers.  Patient is at home and provider is at the office.    I discussed the limitations, risks, security and privacy concerns of performing an evaluation and management service by telephone and the availability of in person appointments. I also discussed with the patient that there may be a patient responsible charge related to this service. The patient expressed understanding and agreed to proceed.  Subjective:  Robin Webster is a 29 y.o. G3P2002 at [redacted]w[redacted]d being followed for ongoing prenatal care.  She is currently monitored for the following issues for this low-risk pregnancy and has Supervision of normal pregnancy; Chlamydia infection affecting pregnancy in second trimester; and [redacted] weeks gestation of pregnancy on their problem list.  Patient reports no complaints. Reports fetal movement. Denies any contractions, bleeding or leaking of fluid.   The following portions of the patient's history were reviewed and updated as appropriate: allergies, current medications, past family history, past medical history, past social history, past surgical history and problem list.   Objective:   General:  Alert, oriented and cooperative.   Mental Status: Normal mood and affect perceived. Normal judgment and thought content.  Rest of physical exam deferred due to type of encounter  Assessment and Plan:  Pregnancy: G3P2002 at [redacted]w[redacted]d 1. Chlamydia infection affecting pregnancy in second trimester - TOC on 01/12/2020 was negative   2. Encounter for supervision of other normal pregnancy in third trimester   3. [redacted] weeks gestation of pregnancy   Preterm labor symptoms and general obstetric precautions including but not limited to vaginal bleeding, contractions, leaking of fluid and fetal movement were  reviewed in detail with the patient.  I discussed the assessment and treatment plan with the patient. The patient was provided an opportunity to ask questions and all were answered. The patient agreed with the plan and demonstrated an understanding of the instructions. The patient was advised to call back or seek an in-person office evaluation/go to MAU at [facility] for any urgent or concerning symptoms. Please refer to After Visit Summary for other counseling recommendations.   I provided 12 minutes of non-face-to-face time during this encounter.  Return in about 2 weeks (around 03/07/2020).  No future appointments.  Thressa Sheller DNP, CNM  02/22/20  3:05 PM

## 2020-03-08 ENCOUNTER — Encounter: Payer: Medicaid Other | Admitting: Obstetrics

## 2020-03-18 NOTE — L&D Delivery Note (Addendum)
Delivery Note  Robin Webster is a 30 y.o. s/p vaginal delivery at [redacted]w[redacted]d. She was admitted for labor in the setting of SROM and regular ctx.    ROM: 14h 29m with clear fluid GBS Status: negative Maximum Maternal Temperature: 98.27F   Labor Progress: Pt in latent labor on admission.  She continued to progress well. Pitocin was initiated and pt then was noted to have complete cervical dilation. She then delivered without complication as noted below.   Delivery Date/Time: 05/07/2020 at 10:02am Delivery: Called to room and patient was complete and pushing. Head delivered LOA. No nuchal cord present, short cord noted. Shoulder and body delivered in usual fashion. Infant with spontaneous cry, placed on mother's abdomen, dried and stimulated. Cord clamped x 2 after 1-minute delay, and cut by FOB under my direct supervision. Cord blood drawn. Placenta delivered spontaneously with gentle cord traction. Fundus firm with massage and Pitocin. Labia, perineum, vagina, and cervix were inspected; no lacerations visualized.    Placenta: intact, 3-vessel cord, sent for pathology due to marginal cord Complications: none Lacerations: none EBL: 100 ml Analgesia: epidural  Infant: female  APGARs 8 & 9  weight per medical record  Mom to postpartum.  Baby to Nursery.  Maury Dus 05/07/2020, 10:16 AM  GME ATTESTATION:  I saw and evaluated the patient. I agree with the findings and the plan of care as documented in the resident's note.  Alric Seton, MD OB Fellow, Faculty [facility], Center for [facility] 05/07/2020 12:21 PM

## 2020-03-20 ENCOUNTER — Inpatient Hospital Stay (HOSPITAL_COMMUNITY)
Admission: AD | Admit: 2020-03-20 | Discharge: 2020-03-20 | Disposition: A | Discharge status: Home / Self Care | Payer: Medicaid Other | Attending: Family Medicine | Admitting: Family Medicine

## 2020-03-20 ENCOUNTER — Other Ambulatory Visit: Payer: Self-pay

## 2020-03-20 DIAGNOSIS — T85848A Pain due to other internal prosthetic devices, implants and grafts, initial encounter: Secondary | ICD-10-CM

## 2020-03-20 DIAGNOSIS — Z3A31 31 weeks gestation of pregnancy: Secondary | ICD-10-CM

## 2020-03-20 DIAGNOSIS — O99613 Diseases of the digestive system complicating pregnancy, third trimester: Secondary | ICD-10-CM | POA: Diagnosis not present

## 2020-03-20 DIAGNOSIS — K0889 Other specified disorders of teeth and supporting structures: Secondary | ICD-10-CM | POA: Diagnosis not present

## 2020-03-20 MED ORDER — OXYCODONE-ACETAMINOPHEN 5-325 MG PO TABS: 1.0000 | ORAL_TABLET | Freq: Four times a day (QID) | ORAL | 0 refills | Status: AC | PRN

## 2020-03-20 MED ORDER — AMOXICILLIN 500 MG PO CAPS: 500.0000 mg | ORAL_CAPSULE | Freq: Three times a day (TID) | ORAL | 0 refills | Status: DC

## 2020-03-20 NOTE — MAU Note (Signed)
Her tooth cracked- happened Sat night, has a bad toothache.  (upper left side)  Ear and face hurts, can't sleep.  Taken Tylenol, not helping. Called dentist this morning, can't get in until Wed.   Denies any OB problems, no abd pain, bleeding, LOF. Reports +FM

## 2020-03-20 NOTE — Discharge Instructions (Signed)
Dental Abscess  A dental abscess is a collection of pus in or around a tooth that results from an infection. An abscess can cause pain in the affected area as well as other symptoms. Treatment is important to help with symptoms and to prevent the infection from spreading. What are the causes? This condition is caused by a bacterial infection around the root of the tooth that involves the inner part of the tooth (pulp). It may result from:  Severe tooth decay.  Trauma to the tooth, such as a broken or chipped tooth, that allows bacteria to enter into the pulp.  Severe gum disease around a tooth. What increases the risk? This condition is more likely to develop in males. It is also more likely to develop in people who:  Have dental decay (cavities).  Eat sugary snacks between meals.  Use tobacco products.  Have diabetes.  Have a weakened disease-fighting system (immune system).  Do not brush and care for their teeth regularly. What are the signs or symptoms? Symptoms of this condition include:  Severe pain in and around the infected tooth.  Swelling and redness around the infected tooth, in the mouth, or in the face.  Tenderness.  Pus drainage.  Bad breath.  Bitter taste in the mouth.  Difficulty swallowing.  Difficulty opening the mouth.  Nausea.  Vomiting.  Chills.  Swollen neck glands.  Fever. How is this diagnosed? This condition is diagnosed based on:  Your symptoms and your medical and dental history.  An examination of the infected tooth. During the exam, your dentist may tap on the infected tooth. You may also have X-rays of the affected area. How is this treated? This condition is treated by getting rid of the infection. This may be done with:  Incision and drainage. This procedure is done by making an incision in the abscess to drain out the pus. Removing pus is the first priority in treating an abscess.  Antibiotic medicines. These may be used  in certain situations.  Antibacterial mouth rinse.  A root canal. This may be performed to save the tooth. Your dentist accesses the visible part of your tooth (crown) with a drill and removes any damaged pulp. Then the space is filled and sealed off.  Tooth extraction. The tooth is pulled out if it cannot be saved by other treatment. You may also receive treatment for pain, such as:  Acetaminophen or NSAIDs.  Gels that contain a numbing medicine.  An injection to block the pain near your nerve. Follow these instructions at home: Medicines  Take over-the-counter and prescription medicines only as told by your dentist.  If you were prescribed an antibiotic, take it as told by your dentist. Do not stop taking the antibiotic even if you start to feel better.  If you were prescribed a gel that contains a numbing medicine, use it exactly as told in the directions. Do not use these gels for children who are younger than 2 years of age.  Do not drive or use heavy machinery while taking prescription pain medicine. General instructions  Rinse out your mouth often with salt water to relieve pain or swelling. To make a salt-water mixture, completely dissolve -1 tsp of salt in 1 cup of warm water.  Eat a soft diet while your abscess is healing.  Drink enough fluid to keep your urine pale yellow.  Do not apply heat to the outside of your mouth.  Do not use any products that contain nicotine or   tobacco, such as cigarettes and e-cigarettes. If you need help quitting, ask your health care provider.  Keep all follow-up visits as told by your dentist. This is important. How is this prevented?  Brush your teeth every morning and night with fluoride toothpaste. Floss one time each day.  Get regularly scheduled dental cleanings.  Consider having a dental sealant applied on teeth that have deep holes (caries).  Drink fluoridated water regularly. This includes most tap water. Check the label  on bottled water to see if it contains fluoride.  Drink water instead of sugary drinks.  Eat healthy meals and snacks.  Wear a mouth guard or face shield to protect your teeth while playing sports. Contact a health care provider if:  Your pain is worse and is not helped by medicine. Get help right away if:  You have a fever or chills.  Your symptoms suddenly get worse.  You have a very bad headache.  You have problems breathing or swallowing.  You have trouble opening your mouth.  You have swelling in your neck or around your eye. Summary  A dental abscess is a collection of pus in or around a tooth that results from an infection.  A dental abscess may result from severe tooth decay, trauma to the tooth, or severe gum disease around a tooth.  Symptoms include severe pain, swelling, redness, and drainage of pus in and around the infected tooth.  The first priority in treating a dental abscess is to drain out the pus. Treatment may also involve removing damage inside the tooth (root canal) or pulling out (extracting) the tooth. This information is not intended to replace advice given to you by your health care provider. Make sure you discuss any questions you have with your health care provider. Document Revised: 02/14/2017 Document Reviewed: 11/04/2016 Elsevier Patient Education  2020 Elsevier Inc.  

## 2020-03-20 NOTE — MAU Provider Note (Signed)
Event Date/Time   First Provider Initiated Contact with Patient 03/20/20 1017     S Ms. Robin Webster is a 30 y.o. G6P2002 pregnant female at [redacted]w[redacted]d who presents to MAU today with complaint of a cracked tooth that is now causing severe dental pain that radiates into the sinus above the tooth. She has a dental appointment on Wednesday, but "cannot take the pain" until then. She denies nausea/vomiting, fever, decreased fetal movement, vaginal bleeding or LOF.   O BP 126/77 (BP Location: Right Arm)   Pulse 88   Temp 98.4 F (36.9 C) (Oral)   Resp 17   Wt 168 lb 11.2 oz (76.5 kg)   LMP 08/15/2019   SpO2 100%   BMI 30.86 kg/m  Physical Exam Vitals and nursing note reviewed.  Constitutional:      General: She is in acute distress (in obvious pain).     Appearance: Normal appearance. She is normal weight. She is not ill-appearing.  HENT:     Mouth/Throat:     Mouth: Mucous membranes are moist.  Eyes:     General:        Left eye: Discharge (watering) present.    Pupils: Pupils are equal, round, and reactive to light.  Cardiovascular:     Rate and Rhythm: Normal rate.     Pulses: Normal pulses.  Pulmonary:     Effort: Pulmonary effort is normal.  Abdominal:     Palpations: Abdomen is soft.  Musculoskeletal:        General: Normal range of motion.     Cervical back: Normal range of motion.  Skin:    General: Skin is warm and dry.     Capillary Refill: Capillary refill takes less than 2 seconds.  Neurological:     Mental Status: She is alert and oriented to person, place, and time.  Psychiatric:        Mood and Affect: Mood normal.        Behavior: Behavior normal.        Thought Content: Thought content normal.        Judgment: Judgment normal.    A Dental pain, initial encounter Medical screening exam complete  P Discharge from MAU in stable condition with prescriptions for antibiotics and pain medications Pt will follow up with dentist for tooth repair/extraction and  [facility] for ongoing prenatal care Warning signs for worsening condition that would warrant emergency follow-up discussed Patient may return to MAU as needed for emergent OB/GYN related complaints  Bernerd Limbo, CNM 03/20/2020 10:57 AM

## 2020-05-06 ENCOUNTER — Other Ambulatory Visit: Payer: Self-pay

## 2020-05-06 ENCOUNTER — Inpatient Hospital Stay (HOSPITAL_COMMUNITY)
Admission: AD | Admit: 2020-05-06 | Discharge: 2020-05-08 | DRG: 805 | Disposition: A | Discharge status: Home / Self Care | Payer: Medicaid Other | Attending: Obstetrics & Gynecology | Admitting: Obstetrics & Gynecology

## 2020-05-06 ENCOUNTER — Encounter (HOSPITAL_COMMUNITY): Payer: Self-pay | Admitting: Obstetrics & Gynecology

## 2020-05-06 DIAGNOSIS — U071 COVID-19: Secondary | ICD-10-CM | POA: Diagnosis present

## 2020-05-06 DIAGNOSIS — O9852 Other viral diseases complicating childbirth: Secondary | ICD-10-CM | POA: Diagnosis present

## 2020-05-06 DIAGNOSIS — O98513 Other viral diseases complicating pregnancy, third trimester: Secondary | ICD-10-CM | POA: Diagnosis present

## 2020-05-06 DIAGNOSIS — O4202 Full-term premature rupture of membranes, onset of labor within 24 hours of rupture: Secondary | ICD-10-CM | POA: Diagnosis not present

## 2020-05-06 DIAGNOSIS — O9902 Anemia complicating childbirth: Secondary | ICD-10-CM | POA: Diagnosis present

## 2020-05-06 DIAGNOSIS — O26893 Other specified pregnancy related conditions, third trimester: Secondary | ICD-10-CM | POA: Diagnosis present

## 2020-05-06 DIAGNOSIS — O4290 Premature rupture of membranes, unspecified as to length of time between rupture and onset of labor, unspecified weeks of gestation: Secondary | ICD-10-CM | POA: Diagnosis present

## 2020-05-06 DIAGNOSIS — O411230 Chorioamnionitis, third trimester, not applicable or unspecified: Secondary | ICD-10-CM | POA: Diagnosis not present

## 2020-05-06 DIAGNOSIS — Z87891 Personal history of nicotine dependence: Secondary | ICD-10-CM

## 2020-05-06 DIAGNOSIS — Z3A37 37 weeks gestation of pregnancy: Secondary | ICD-10-CM

## 2020-05-06 DIAGNOSIS — O4292 Full-term premature rupture of membranes, unspecified as to length of time between rupture and onset of labor: Principal | ICD-10-CM | POA: Diagnosis present

## 2020-05-06 DIAGNOSIS — D649 Anemia, unspecified: Secondary | ICD-10-CM | POA: Diagnosis present

## 2020-05-06 DIAGNOSIS — Z3490 Encounter for supervision of normal pregnancy, unspecified, unspecified trimester: Secondary | ICD-10-CM

## 2020-05-06 DIAGNOSIS — O43193 Other malformation of placenta, third trimester: Secondary | ICD-10-CM | POA: Diagnosis not present

## 2020-05-06 DIAGNOSIS — A749 Chlamydial infection, unspecified: Secondary | ICD-10-CM | POA: Diagnosis present

## 2020-05-06 DIAGNOSIS — Z3483 Encounter for supervision of other normal pregnancy, third trimester: Secondary | ICD-10-CM

## 2020-05-06 DIAGNOSIS — O0930 Supervision of pregnancy with insufficient antenatal care, unspecified trimester: Secondary | ICD-10-CM

## 2020-05-06 LAB — URINALYSIS, ROUTINE W REFLEX MICROSCOPIC
Bilirubin Urine: NEGATIVE
Glucose, UA: NEGATIVE mg/dL
Ketones, ur: NEGATIVE mg/dL
Nitrite: NEGATIVE
Protein, ur: NEGATIVE mg/dL
Specific Gravity, Urine: 1.003 — ABNORMAL LOW (ref 1.005–1.030)
WBC, UA: >50 WBC/hpf — ABNORMAL HIGH (ref 0–5)
pH: 7 (ref 5.0–8.0)

## 2020-05-06 LAB — TYPE AND SCREEN
ABO/RH(D): O POS
Antibody Screen: NEGATIVE

## 2020-05-06 LAB — CBC
HCT: 31.8 % — ABNORMAL LOW (ref 36.0–46.0)
Hemoglobin: 10.4 g/dL — ABNORMAL LOW (ref 12.0–15.0)
MCH: 28.9 pg (ref 26.0–34.0)
MCHC: 32.7 g/dL (ref 30.0–36.0)
MCV: 88.3 fL (ref 80.0–100.0)
Platelets: 209 10*3/uL (ref 150–400)
RBC: 3.6 MIL/uL — ABNORMAL LOW (ref 3.87–5.11)
RDW: 11.7 % (ref 11.5–15.5)
WBC: 15.2 10*3/uL — ABNORMAL HIGH (ref 4.0–10.5)
nRBC: 0 % (ref 0.0–0.2)

## 2020-05-06 LAB — RESP PANEL BY RT-PCR (FLU A&B, COVID) ARPGX2
Influenza A by PCR: NEGATIVE
Influenza B by PCR: NEGATIVE
SARS Coronavirus 2 by RT PCR: POSITIVE — AB

## 2020-05-06 LAB — POCT FERN TEST: POCT Fern Test: POSITIVE

## 2020-05-06 LAB — GROUP B STREP BY PCR: Group B strep by PCR: NEGATIVE

## 2020-05-06 MED ORDER — ONDANSETRON HCL 4 MG/2ML IJ SOLN: 4.0000 mg | Freq: Four times a day (QID) | INTRAMUSCULAR | Status: DC | PRN

## 2020-05-06 MED ORDER — OXYTOCIN-SODIUM CHLORIDE 30-0.9 UT/500ML-% IV SOLN
2.5000 [IU]/h | INTRAVENOUS | Status: DC
  Filled 2020-05-06: qty 500

## 2020-05-06 MED ORDER — ACETAMINOPHEN 325 MG PO TABS: 650.0000 mg | ORAL_TABLET | ORAL | Status: DC | PRN

## 2020-05-06 MED ORDER — SOD CITRATE-CITRIC ACID 500-334 MG/5ML PO SOLN: 30.0000 mL | ORAL | Status: DC | PRN

## 2020-05-06 MED ORDER — OXYTOCIN BOLUS FROM INFUSION
333.0000 mL | Freq: Once | INTRAVENOUS | Status: AC
  Administered 2020-05-07: 333 mL via INTRAVENOUS

## 2020-05-06 MED ORDER — LACTATED RINGERS IV SOLN: 500.0000 mL | INTRAVENOUS | Status: DC | PRN

## 2020-05-06 MED ORDER — OXYCODONE-ACETAMINOPHEN 5-325 MG PO TABS: 2.0000 | ORAL_TABLET | ORAL | Status: DC | PRN

## 2020-05-06 MED ORDER — LACTATED RINGERS IV SOLN: INTRAVENOUS | Status: DC

## 2020-05-06 MED ORDER — LIDOCAINE HCL (PF) 1 % IJ SOLN: 30.0000 mL | INTRAMUSCULAR | Status: DC | PRN

## 2020-05-06 MED ORDER — OXYCODONE-ACETAMINOPHEN 5-325 MG PO TABS: 1.0000 | ORAL_TABLET | ORAL | Status: DC | PRN

## 2020-05-06 MED ORDER — FENTANYL CITRATE (PF) 100 MCG/2ML IJ SOLN
100.0000 ug | INTRAMUSCULAR | Status: DC | PRN
  Administered 2020-05-06 – 2020-05-07 (×4): 100 ug via INTRAVENOUS
  Filled 2020-05-06 (×4): qty 2

## 2020-05-06 NOTE — MAU Note (Signed)
Patient reports to triage with complaints of ROM since 1940. Patient denies bleeding. Patient reports positive fetal movements. Patient denies any recent illnessess

## 2020-05-06 NOTE — MAU Provider Note (Signed)
Pt informed that the ultrasound is considered a limited OB ultrasound and is not intended to be a complete ultrasound exam.  Patient also informed that the ultrasound is not being completed with the intent of assessing for fetal or placental anomalies or any pelvic abnormalities.  Explained that the purpose of today's ultrasound is to assess for  presentation.  Patient acknowledges the purpose of the exam and the limitations of the study.     Vertex position.  Robin Lope, NP 05/06/2020 8:35 PM

## 2020-05-06 NOTE — H&P (Signed)
Robin Webster is a 30 y.o. G36P2002 female at 26w6dby LMP c/w 19wk anatomy scan presenting for SROM at 1930 this evening with reg ctx to follow.   Reports active fetal movement, contractions: regular, every 3 minutes, vaginal bleeding: none, membranes: intact, ruptured.  Initiated prenatal care at [facility]  at 13.3 wks, and had regular care until 27 wks (no visits since).   Most recent u/s at 19.3wks, EFW 27th%, nl fluid.  This pregnancy complicated by: Limited PNC (13-27wks) Second trimester chlamydia with neg TOC 01/12/20  Prenatal History/Complications:  Vag del x 2  Past Medical History: Past Medical History:  Diagnosis Date  . Vaginal Pap smear, abnormal     Past Surgical History: Past Surgical History:  Procedure Laterality Date  . NO PAST SURGERIES      Obstetrical History: OB History    Gravida  3   Para  2   Term  2   Preterm      AB      Living  2     SAB      IAB      Ectopic      Multiple  0   Live Births  2        Obstetric Comments  1- [place] 2-[facility]        Social History: Social History   Socioeconomic History  . Marital status: Single    Spouse name: Not on file  . Number of children: 2  . Years of education: Not on file  . Highest education level: Not on file  Occupational History  . Occupation: [employer]  Tobacco Use  . Smoking status: Former Smoker    Types: Cigarettes    Quit date: 01/26/2017    Years since quitting: 3.2  . Smokeless tobacco: Never Used  Vaping Use  . Vaping Use: Never used  Substance and Sexual Activity  . Alcohol use: Not Currently    Comment: occasionally  . Drug use: Not Currently    Types: Marijuana    Comment: last used 2013   . Sexual activity: Yes    Birth control/protection: None  Other Topics Concern  . Not on file  Social History Narrative  . Not on file   Social Determinants of Health   Financial Resource Strain: Not on file  Food Insecurity: Not on file   Transportation Needs: Not on file  Physical Activity: Not on file  Stress: Not on file  Social Connections: Not on file    Family History: Family History  Problem Relation Age of Onset  . Hypertension Father     Allergies: No Known Allergies  Medications Prior to Admission  Medication Sig Dispense Refill Last Dose  . aspirin EC 81 MG tablet Take 1 tablet (81 mg total) by mouth daily. Swallow whole. 30 tablet 11 Past Month at Unknown time  . Prenatal Vit-Fe Fumarate-FA (PRENATAL VITAMINS PO) Take by mouth.   05/05/2020 at Unknown time  . amoxicillin (AMOXIL) 500 MG capsule Take 1 capsule (500 mg total) by mouth 3 (three) times daily. 15 capsule 0 More than a month at Unknown time  . Blood Pressure Monitoring (BLOOD PRESSURE KIT) DEVI 1 kit by Does not apply route once a week. Check Blood Pressure regularly and record readings into the Babyscripts App.  Large Cuff.  DX O90.0 1 each 0     Review of Systems  Pertinent pos/neg as indicated in HPI  Blood pressure 115/75, pulse 81, temperature 98.2  F (36.8 C), temperature source Oral, resp. rate 16, height '5\' 2"'  (1.575 m), weight 79.4 kg, last menstrual period 08/15/2019, SpO2 99 %, unknown if currently breastfeeding. General appearance: alert, cooperative and mild distress Lungs: clear to auscultation bilaterally Heart: regular rate and rhythm Abdomen: gravid, soft, non-tender, EFW by Leopold's approximately 7lbs Extremities: tr edema DTR's nl  Fetal monitoring: FHR: 150s bpm, variability: moderate,  Accelerations: Present,  decelerations:  Absent Uterine activity: Frequency: Every 3 minutes Dilation: 3.5 Effacement (%): 60 Station: -2 Exam by:: Serita Grammes, CNM Presentation: cephalic   Prenatal labs: ABO, Rh: --/--/O POS (02/19 2035) Antibody: NEG (02/19 2035) Rubella: 1.97 (09/01 1456) RPR: Non Reactive (09/01 1456)  HBsAg: Negative (09/01 1456)  HIV: Non Reactive (09/01 1456)  GBS: NEGATIVE/-- (02/19 2030)  2hr  GTT: normal  Prenatal Transfer Tool  Maternal Diabetes: No Genetic Screening: Normal Maternal Ultrasounds/Referrals: Normal Fetal Ultrasounds or other Referrals:  None Maternal Substance Abuse:  No Significant Maternal Medications:  None Significant Maternal Lab Results: Group B Strep negative; +Covid on admit 2/19  Results for orders placed or performed during the hospital encounter of 05/06/20 (from the past 24 hour(s))  POCT fern test   Collection Time: 05/06/20  8:19 PM  Result Value Ref Range   POCT Fern Test Positive = ruptured amniotic membanes   Group B strep by PCR   Collection Time: 05/06/20  8:30 PM   Specimen: Vaginal/Rectal; Genital  Result Value Ref Range   Group B strep by PCR NEGATIVE NEGATIVE  Resp Panel by RT-PCR (Flu A&B, Covid) Nasopharyngeal Swab   Collection Time: 05/06/20  8:32 PM   Specimen: Nasopharyngeal Swab; Nasopharyngeal(NP) swabs in vial transport medium  Result Value Ref Range   SARS Coronavirus 2 by RT PCR POSITIVE (A) NEGATIVE   Influenza A by PCR NEGATIVE NEGATIVE   Influenza B by PCR NEGATIVE NEGATIVE  CBC   Collection Time: 05/06/20  8:35 PM  Result Value Ref Range   WBC 15.2 (H) 4.0 - 10.5 K/uL   RBC 3.60 (L) 3.87 - 5.11 MIL/uL   Hemoglobin 10.4 (L) 12.0 - 15.0 g/dL   HCT 31.8 (L) 36.0 - 46.0 %   MCV 88.3 80.0 - 100.0 fL   MCH 28.9 26.0 - 34.0 pg   MCHC 32.7 30.0 - 36.0 g/dL   RDW 11.7 11.5 - 15.5 %   Platelets 209 150 - 400 K/uL   nRBC 0.0 0.0 - 0.2 %  Type and screen [place]   Collection Time: 05/06/20  8:35 PM  Result Value Ref Range   ABO/RH(D) O POS    Antibody Screen NEG    Sample Expiration      05/09/2020,2359 Performed at [facility], [address], [place], [place] [address]   Urinalysis, Routine w reflex microscopic Urine, Clean Catch   Collection Time: 05/06/20  8:39 PM  Result Value Ref Range   Color, Urine STRAW (A) YELLOW   APPearance HAZY (A) CLEAR   Specific Gravity, Urine 1.003  (L) 1.005 - 1.030   pH 7.0 5.0 - 8.0   Glucose, UA NEGATIVE NEGATIVE mg/dL   Hgb urine dipstick MODERATE (A) NEGATIVE   Bilirubin Urine NEGATIVE NEGATIVE   Ketones, ur NEGATIVE NEGATIVE mg/dL   Protein, ur NEGATIVE NEGATIVE mg/dL   Nitrite NEGATIVE NEGATIVE   Leukocytes,Ua LARGE (A) NEGATIVE   RBC / HPF 0-5 0 - 5 RBC/hpf   WBC, UA >50 (H) 0 - 5 WBC/hpf   Bacteria, UA  FEW (A) NONE SEEN   Squamous Epithelial / LPF 0-5 0 - 5   Mucus PRESENT      Assessment:  29w6dSIUP  G3P2002  SROM @ 1930  Cat 1 FHR  GBS NEGATIVE/-- (02/19 2030)  Plan:  Admit to L&D  IV pain meds/epidural prn active labor  Expectant management  Droplet/airborne precautions  Anticipate vag del   Plans to breastfeed  Contraception: IUD placed outpatient   KMyrtis SerCNM 05/06/2020, 11:50 PM

## 2020-05-07 ENCOUNTER — Encounter (HOSPITAL_COMMUNITY): Payer: Self-pay | Admitting: Obstetrics & Gynecology

## 2020-05-07 ENCOUNTER — Inpatient Hospital Stay (HOSPITAL_COMMUNITY): Payer: Medicaid Other | Admitting: Anesthesiology

## 2020-05-07 DIAGNOSIS — Z3A37 37 weeks gestation of pregnancy: Secondary | ICD-10-CM

## 2020-05-07 DIAGNOSIS — O411230 Chorioamnionitis, third trimester, not applicable or unspecified: Secondary | ICD-10-CM

## 2020-05-07 DIAGNOSIS — O4202 Full-term premature rupture of membranes, onset of labor within 24 hours of rupture: Secondary | ICD-10-CM

## 2020-05-07 DIAGNOSIS — O43193 Other malformation of placenta, third trimester: Secondary | ICD-10-CM

## 2020-05-07 LAB — RPR: RPR Ser Ql: NONREACTIVE

## 2020-05-07 MED ORDER — SENNOSIDES-DOCUSATE SODIUM 8.6-50 MG PO TABS
2.0000 | ORAL_TABLET | Freq: Every day | ORAL | Status: DC
  Administered 2020-05-08: 2 via ORAL
  Filled 2020-05-07: qty 2

## 2020-05-07 MED ORDER — ONDANSETRON HCL 4 MG PO TABS: 4.0000 mg | ORAL_TABLET | ORAL | Status: DC | PRN

## 2020-05-07 MED ORDER — COCONUT OIL OIL: 1.0000 "application " | TOPICAL_OIL | Status: DC | PRN

## 2020-05-07 MED ORDER — OXYTOCIN-SODIUM CHLORIDE 30-0.9 UT/500ML-% IV SOLN
1.0000 m[IU]/min | INTRAVENOUS | Status: DC
  Administered 2020-05-07: 2 m[IU]/min via INTRAVENOUS

## 2020-05-07 MED ORDER — PHENYLEPHRINE 40 MCG/ML (10ML) SYRINGE FOR IV PUSH (FOR BLOOD PRESSURE SUPPORT)
80.0000 ug | PREFILLED_SYRINGE | INTRAVENOUS | Status: DC | PRN
  Filled 2020-05-07: qty 10

## 2020-05-07 MED ORDER — EPHEDRINE 5 MG/ML INJ: 10.0000 mg | INTRAVENOUS | Status: DC | PRN

## 2020-05-07 MED ORDER — IBUPROFEN 600 MG PO TABS
600.0000 mg | ORAL_TABLET | Freq: Four times a day (QID) | ORAL | Status: DC
  Administered 2020-05-07 – 2020-05-08 (×5): 600 mg via ORAL
  Filled 2020-05-07 (×5): qty 1

## 2020-05-07 MED ORDER — DIBUCAINE (PERIANAL) 1 % EX OINT: 1.0000 "application " | TOPICAL_OINTMENT | CUTANEOUS | Status: DC | PRN

## 2020-05-07 MED ORDER — DIPHENHYDRAMINE HCL 50 MG/ML IJ SOLN: 12.5000 mg | INTRAMUSCULAR | Status: DC | PRN

## 2020-05-07 MED ORDER — DIPHENHYDRAMINE HCL 25 MG PO CAPS: 25.0000 mg | ORAL_CAPSULE | Freq: Four times a day (QID) | ORAL | Status: DC | PRN

## 2020-05-07 MED ORDER — FERROUS SULFATE 325 (65 FE) MG PO TABS: 325.0000 mg | ORAL_TABLET | ORAL | Status: DC

## 2020-05-07 MED ORDER — ZOLPIDEM TARTRATE 5 MG PO TABS
5.0000 mg | ORAL_TABLET | Freq: Every evening | ORAL | Status: DC | PRN
Start: 2020-05-07 — End: 2020-05-08

## 2020-05-07 MED ORDER — WITCH HAZEL-GLYCERIN EX PADS: 1.0000 "application " | MEDICATED_PAD | CUTANEOUS | Status: DC | PRN

## 2020-05-07 MED ORDER — PRENATAL MULTIVITAMIN CH
1.0000 | ORAL_TABLET | Freq: Every day | ORAL | Status: DC
  Administered 2020-05-07 – 2020-05-08 (×2): 1 via ORAL
  Filled 2020-05-07 (×2): qty 1

## 2020-05-07 MED ORDER — FENTANYL-BUPIVACAINE-NACL 0.5-0.125-0.9 MG/250ML-% EP SOLN
12.0000 mL/h | EPIDURAL | Status: DC | PRN
  Administered 2020-05-07: 12 mL/h via EPIDURAL
  Filled 2020-05-07: qty 250

## 2020-05-07 MED ORDER — LACTATED RINGERS IV SOLN
500.0000 mL | Freq: Once | INTRAVENOUS | Status: AC
  Administered 2020-05-07: 500 mL via INTRAVENOUS

## 2020-05-07 MED ORDER — BENZOCAINE-MENTHOL 20-0.5 % EX AERO: 1.0000 "application " | INHALATION_SPRAY | CUTANEOUS | Status: DC | PRN

## 2020-05-07 MED ORDER — MEDROXYPROGESTERONE ACETATE 150 MG/ML IM SUSP: 150.0000 mg | INTRAMUSCULAR | Status: DC | PRN

## 2020-05-07 MED ORDER — TETANUS-DIPHTH-ACELL PERTUSSIS 5-2.5-18.5 LF-MCG/0.5 IM SUSY: 0.5000 mL | PREFILLED_SYRINGE | Freq: Once | INTRAMUSCULAR | Status: DC

## 2020-05-07 MED ORDER — PHENYLEPHRINE 40 MCG/ML (10ML) SYRINGE FOR IV PUSH (FOR BLOOD PRESSURE SUPPORT): 80.0000 ug | PREFILLED_SYRINGE | INTRAVENOUS | Status: DC | PRN

## 2020-05-07 MED ORDER — ACETAMINOPHEN 325 MG PO TABS
650.0000 mg | ORAL_TABLET | ORAL | Status: DC | PRN
  Administered 2020-05-07: 650 mg via ORAL
  Filled 2020-05-07: qty 2

## 2020-05-07 MED ORDER — SIMETHICONE 80 MG PO CHEW: 80.0000 mg | CHEWABLE_TABLET | ORAL | Status: DC | PRN

## 2020-05-07 MED ORDER — LIDOCAINE-EPINEPHRINE (PF) 2 %-1:200000 IJ SOLN
INTRAMUSCULAR | Status: DC | PRN
  Administered 2020-05-07: 5 mL via EPIDURAL

## 2020-05-07 MED ORDER — ONDANSETRON HCL 4 MG/2ML IJ SOLN: 4.0000 mg | INTRAMUSCULAR | Status: DC | PRN

## 2020-05-07 MED ORDER — TERBUTALINE SULFATE 1 MG/ML IJ SOLN: 0.2500 mg | Freq: Once | INTRAMUSCULAR | Status: DC | PRN

## 2020-05-07 NOTE — Discharge Summary (Signed)
   Postpartum Discharge Summary  Patient Name: Robin Webster DOB: 04/05/1990 MRN: [id number]  Date of admission: 05/06/2020 Delivery date:05/07/2020  Delivering provider: FIRESTONE, ALICIA C  Date of discharge: 05/08/2020  Admitting diagnosis: Amniotic fluid leaking [O42.90] Intrauterine pregnancy: [redacted]w[redacted]d     Secondary diagnosis:  Active Problems:   Vaginal delivery   Supervision of normal pregnancy   Chlamydia infection affecting pregnancy in second trimester   Amniotic fluid leaking   COVID-19 affecting pregnancy in third trimester   Limited prenatal care  Additional problems: none    Discharge diagnosis: Term Pregnancy Delivered and Anemia                                              Post partum procedures:none Augmentation: Pitocin Complications: None  Hospital course: Onset of Labor With Vaginal Delivery      30 y.o. yo G3P2002 at [redacted]w[redacted]d was admitted in Latent Labor on 05/06/2020. Patient had an uncomplicated labor course as follows:  Membrane Rupture Time/Date: 7:40 PM ,05/06/2020   Delivery Method:Vaginal, Spontaneous  Episiotomy: None  Lacerations:  None  Patient had an uncomplicated postpartum course.  She is ambulating, tolerating a regular diet, passing flatus, and urinating well. Patient is discharged home in stable condition on 05/08/20.  Newborn Data: Birth date:05/07/2020  Birth time:10:02 AM  Gender:Female  Living status:Living  Apgars:8 ,9  Weight:2716 g   Magnesium Sulfate received: No BMZ received: No Rhophylac:N/A MMR:N/A T-DaP:Given prenatally Flu: No Transfusion:No  Physical exam  Vitals:   05/07/20 1329 05/07/20 1727 05/07/20 2040 05/08/20 0047  BP: 123/69 122/78 110/71 112/73  Pulse: 71 78 80 79  Resp: 18 17 20   Temp:  98.5 F (36.9 C) 98.1 F (36.7 C) 98.1 F (36.7 C)  TempSrc:  Oral  Oral  SpO2: 100%  100%   Weight:      Height:       General: alert, cooperative and no distress Lochia: appropriate Uterine Fundus: firm Incision:  N/A DVT Evaluation: No evidence of DVT seen on physical exam. Labs: Lab Results  Component Value Date   WBC 15.2 (H) 05/06/2020   HGB 10.4 (L) 05/06/2020   HCT 31.8 (L) 05/06/2020   MCV 88.3 05/06/2020   PLT 209 05/06/2020   CMP Latest Ref Rng & Units 11/17/2019  Glucose 65 - 99 mg/dL 103(H)  BUN 6 - 20 mg/dL 6  Creatinine 0.57 - 1.00 mg/dL 0.37(L)  Sodium 134 - 144 mmol/L 137  Potassium 3.5 - 5.2 mmol/L 4.3  Chloride 96 - 106 mmol/L 105  CO2 20 - 29 mmol/L 20  Calcium 8.7 - 10.2 mg/dL 9.3  Total Protein 6.0 - 8.5 g/dL 6.8  Total Bilirubin 0.0 - 1.2 mg/dL 0.2  Alkaline Phos 48 - 121 IU/L 137(H)  AST 0 - 40 IU/L 19  ALT 0 - 32 IU/L 18   Edinburgh Score: Edinburgh Postnatal Depression Scale Screening Tool 08/25/2017  I have been able to laugh and see the funny side of things. 0  I have looked forward with enjoyment to things. 0  I have blamed myself unnecessarily when things went wrong. 0  I have been anxious or worried for no good reason. 0  I have felt scared or panicky for no good reason. 0  Things have been getting on top of me. 0  I have been so unhappy that I have   had difficulty sleeping. 0  I have felt sad or miserable. 0  I have been so unhappy that I have been crying. 0  The thought of harming myself has occurred to me. 0  Edinburgh Postnatal Depression Scale Total 0     After visit meds:  Allergies as of 05/08/2020   No Known Allergies     Medication List    TAKE these medications   acetaminophen 325 MG tablet Commonly known as: Tylenol Take 2 tablets (650 mg total) by mouth every 4 (four) hours as needed (for pain scale < 4).   amoxicillin 500 MG capsule Commonly known as: AMOXIL Take 1 capsule (500 mg total) by mouth 3 (three) times daily.   aspirin EC 81 MG tablet Take 1 tablet (81 mg total) by mouth daily. Swallow whole.   Blood Pressure Kit Devi 1 kit by Does not apply route once a week. Check Blood Pressure regularly and record readings into the  Babyscripts App.  Large Cuff.  DX O90.0   ferrous sulfate 325 (65 FE) MG tablet Take 1 tablet (325 mg total) by mouth every other day.   ibuprofen 600 MG tablet Commonly known as: ADVIL Take 1 tablet (600 mg total) by mouth every 6 (six) hours.   PRENATAL VITAMINS PO Take by mouth.   senna-docusate 8.6-50 MG tablet Commonly known as: Senokot-S Take 2 tablets by mouth daily.        Discharge home in stable condition Infant Feeding: Breast Infant Disposition:home with mother Discharge instruction: per After Visit Summary and Postpartum booklet. Activity: Advance as tolerated. Pelvic rest for 6 weeks.  Diet: routine diet Future Appointments:No future appointments. Follow up Visit: Message sent to [facility] 05/07/20 by Sylvester Harder.   Please schedule this patient for a In person postpartum visit in 6 weeks with the following provider: Any provider. Additional Postpartum F/U:2 hour GTT (early a1c 5.7) Low risk pregnancy complicated by: n/a Delivery mode:  Vaginal, Spontaneous  Anticipated Birth Control:  IUD, place at postpartum visit   05/08/2020 Janet Berlin, MD

## 2020-05-07 NOTE — Anesthesia Postprocedure Evaluation (Signed)
Anesthesia Post Note  Patient: Robin Webster  Procedure(s) Performed: AN AD HOC LABOR EPIDURAL     Patient location during evaluation: Mother Baby Anesthesia Type: Epidural Level of consciousness: awake Pain management: satisfactory to patient Vital Signs Assessment: post-procedure vital signs reviewed and stable Respiratory status: spontaneous breathing Cardiovascular status: stable Anesthetic complications: no   No complications documented.  Last Vitals:  Vitals:   05/07/20 1203 05/07/20 1329  BP: 118/72 123/69  Pulse: 68 71  Resp: 17 18  Temp: 37.2 C   SpO2: 100% 100%    Last Pain:  Vitals:   05/07/20 1330  TempSrc:   PainSc: 2    Pain Goal: Patients Stated Pain Goal: 0 (05/07/20 0703)                 Cephus Shelling

## 2020-05-07 NOTE — Anesthesia Preprocedure Evaluation (Signed)
Anesthesia Evaluation  Patient identified by MRN, date of birth, ID band Patient awake    Reviewed: Allergy & Precautions, NPO status , Patient's Chart, lab work & pertinent test results  Airway Mallampati: II  TM Distance: >3 FB Neck ROM: Full    Dental no notable dental hx.    Pulmonary neg pulmonary ROS, former smoker,    Pulmonary exam normal breath sounds clear to auscultation       Cardiovascular negative cardio ROS Normal cardiovascular exam Rhythm:Regular Rate:Normal     Neuro/Psych negative neurological ROS  negative psych ROS   GI/Hepatic negative GI ROS, Neg liver ROS,   Endo/Other  negative endocrine ROS  Renal/GU negative Renal ROS  negative genitourinary   Musculoskeletal negative musculoskeletal ROS (+)   Abdominal   Peds  Hematology negative hematology ROS (+)   Anesthesia Other Findings COVID positive, no symptoms  Reproductive/Obstetrics (+) Pregnancy                             Anesthesia Physical Anesthesia Plan  ASA: III  Anesthesia Plan: Epidural   Post-op Pain Management:    Induction:   PONV Risk Score and Plan: Treatment may vary due to age or medical condition  Airway Management Planned: Natural Airway  Additional Equipment:   Intra-op Plan:   Post-operative Plan:   Informed Consent: I have reviewed the patients History and Physical, chart, labs and discussed the procedure including the risks, benefits and alternatives for the proposed anesthesia with the patient or authorized representative who has indicated his/her understanding and acceptance.       Plan Discussed with: Anesthesiologist  Anesthesia Plan Comments: (Patient identified. Risks, benefits, options discussed with patient including but not limited to bleeding, infection, nerve damage, paralysis, failed block, incomplete pain control, headache, blood pressure changes, nausea,  vomiting, reactions to medication, itching, and post partum back pain. Confirmed with bedside nurse the patient's most recent platelet count. Confirmed with the patient that they are not taking any anticoagulation, have any bleeding history or any family history of bleeding disorders. Patient expressed understanding and wishes to proceed. All questions were answered. )        Anesthesia Quick Evaluation

## 2020-05-07 NOTE — Anesthesia Procedure Notes (Signed)
Epidural Patient location during procedure: OB Start time: 05/07/2020 7:50 AM End time: 05/07/2020 8:00 AM  Staffing Anesthesiologist: Elmer Picker, MD Performed: anesthesiologist   Preanesthetic Checklist Completed: patient identified, IV checked, risks and benefits discussed, monitors and equipment checked, pre-op evaluation and timeout performed  Epidural Patient position: sitting Prep: DuraPrep and site prepped and draped Patient monitoring: continuous pulse ox, blood pressure, heart rate and cardiac monitor Approach: midline Location: L3-L4 Injection technique: LOR air  Needle:  Needle type: Tuohy  Needle gauge: 17 G Needle length: 9 cm Needle insertion depth: 5 cm Catheter type: closed end flexible Catheter size: 19 Gauge Catheter at skin depth: 10 cm Test dose: negative  Assessment Sensory level: T8 Events: blood not aspirated, injection not painful, no injection resistance, no paresthesia and negative IV test  Additional Notes Patient identified. Risks/Benefits/Options discussed with patient including but not limited to bleeding, infection, nerve damage, paralysis, failed block, incomplete pain control, headache, blood pressure changes, nausea, vomiting, reactions to medication both or allergic, itching and postpartum back pain. Confirmed with bedside nurse the patient's most recent platelet count. Confirmed with patient that they are not currently taking any anticoagulation, have any bleeding history or any family history of bleeding disorders. Patient expressed understanding and wished to proceed. All questions were answered. Sterile technique was used throughout the entire procedure. Please see nursing notes for vital signs. Test dose was given through epidural catheter and negative prior to continuing to dose epidural or start infusion. Warning signs of high block given to the patient including shortness of breath, tingling/numbness in hands, complete motor block,  or any concerning symptoms with instructions to call for help. Patient was given instructions on fall risk and not to get out of bed. All questions and concerns addressed with instructions to call with any issues or inadequate analgesia.  Reason for block:procedure for pain

## 2020-05-07 NOTE — Progress Notes (Signed)
Patient ID: Robin Webster, female   DOB: 27-Nov-1990, 30 y.o.   MRN: [id number]  Strip note while pt is getting an epidural placed; had Pitocin started around 5-6am due to minimal cervical change after ROM 11hrs prior  BP 116/70, P 71 FHR 130s, +accels, no decels Ctx q 4 mins with Pit at 79mu/min Cx was 4/60/vtx -3 @ RN exam 0443  IUP@38 .0wks PROM x 12h Latent labor with Pit  -Continue keeping ctx reg with Pit -Anticipate vag del  Arabella Merles CNM 05/07/2020

## 2020-05-07 NOTE — Progress Notes (Signed)
CSW acknowledged consult and completed a chart review.  CSW called and spoke with MOB over the phone due to MOB's COVID status. CSW assessed for privacy and MOB gave CSW permission to complete the assessment via telephone. CSW asked about a lack of PNC follow-up and MOB stated, I had a lot going on; I wasn't feeling well and I had death in my immediate family."  CSW assessed for barriers for follow-up for MOB and infant post discharge. MOB denied any barriers and reported having reliable transportation.  MOB also shared that infant will receive follow-up care with [facility].   Please contact the Clinical Social Worker if needs arise, by [facility] request, or if MOB scores greater than 9/yes to question 10 on Edinburgh Postpartum Depression Screen.  There are no barriers to discharge.  Blaine Hamper, MSW, LCSW Clinical Social Work [phone]

## 2020-05-08 MED ORDER — SENNOSIDES-DOCUSATE SODIUM 8.6-50 MG PO TABS: 2.0000 | ORAL_TABLET | Freq: Every day | ORAL | 1 refills | Status: DC

## 2020-05-08 MED ORDER — FERROUS SULFATE 325 (65 FE) MG PO TABS: 325.0000 mg | ORAL_TABLET | ORAL | 3 refills | Status: DC

## 2020-05-08 MED ORDER — ACETAMINOPHEN 325 MG PO TABS: 650.0000 mg | ORAL_TABLET | ORAL | Status: DC | PRN

## 2020-05-08 MED ORDER — IBUPROFEN 600 MG PO TABS: 600.0000 mg | ORAL_TABLET | Freq: Four times a day (QID) | ORAL | 0 refills | Status: DC

## 2020-05-09 LAB — SURGICAL PATHOLOGY

## 2020-12-12 ENCOUNTER — Other Ambulatory Visit: Payer: Self-pay

## 2020-12-12 ENCOUNTER — Encounter (HOSPITAL_COMMUNITY): Payer: Self-pay | Admitting: Emergency Medicine

## 2020-12-12 ENCOUNTER — Emergency Department (HOSPITAL_COMMUNITY)
Admission: EM | Admit: 2020-12-12 | Discharge: 2020-12-13 | Disposition: A | Discharge status: Home / Self Care | Payer: Medicaid Other | Attending: Emergency Medicine | Admitting: Emergency Medicine

## 2020-12-12 DIAGNOSIS — R112 Nausea with vomiting, unspecified: Secondary | ICD-10-CM | POA: Diagnosis not present

## 2020-12-12 DIAGNOSIS — K2900 Acute gastritis without bleeding: Secondary | ICD-10-CM | POA: Diagnosis not present

## 2020-12-12 DIAGNOSIS — Z87891 Personal history of nicotine dependence: Secondary | ICD-10-CM | POA: Insufficient documentation

## 2020-12-12 DIAGNOSIS — R102 Pelvic and perineal pain: Secondary | ICD-10-CM | POA: Insufficient documentation

## 2020-12-12 DIAGNOSIS — Z8616 Personal history of COVID-19: Secondary | ICD-10-CM | POA: Insufficient documentation

## 2020-12-12 DIAGNOSIS — R1013 Epigastric pain: Secondary | ICD-10-CM | POA: Diagnosis present

## 2020-12-12 LAB — CBC WITH DIFFERENTIAL/PLATELET
Abs Immature Granulocytes: 0.01 10*3/uL (ref 0.00–0.07)
Basophils Absolute: 0 10*3/uL (ref 0.0–0.1)
Basophils Relative: 0 %
Eosinophils Absolute: 0.1 10*3/uL (ref 0.0–0.5)
Eosinophils Relative: 1 %
HCT: 34 % — ABNORMAL LOW (ref 36.0–46.0)
Hemoglobin: 10.9 g/dL — ABNORMAL LOW (ref 12.0–15.0)
Immature Granulocytes: 0 %
Lymphocytes Relative: 31 %
Lymphs Abs: 2.4 10*3/uL (ref 0.7–4.0)
MCH: 26.5 pg (ref 26.0–34.0)
MCHC: 32.1 g/dL (ref 30.0–36.0)
MCV: 82.7 fL (ref 80.0–100.0)
Monocytes Absolute: 0.9 10*3/uL (ref 0.1–1.0)
Monocytes Relative: 11 %
Neutro Abs: 4.4 10*3/uL (ref 1.7–7.7)
Neutrophils Relative %: 57 %
Platelets: 297 10*3/uL (ref 150–400)
RBC: 4.11 MIL/uL (ref 3.87–5.11)
RDW: 13.1 % (ref 11.5–15.5)
WBC: 7.9 10*3/uL (ref 4.0–10.5)
nRBC: 0 % (ref 0.0–0.2)

## 2020-12-12 LAB — URINALYSIS, ROUTINE W REFLEX MICROSCOPIC
Bacteria, UA: NONE SEEN
Bilirubin Urine: NEGATIVE
Glucose, UA: NEGATIVE mg/dL
Ketones, ur: NEGATIVE mg/dL
Leukocytes,Ua: NEGATIVE
Nitrite: NEGATIVE
Protein, ur: NEGATIVE mg/dL
Specific Gravity, Urine: >1.03 — ABNORMAL HIGH (ref 1.005–1.030)
pH: 6 (ref 5.0–8.0)

## 2020-12-12 LAB — COMPREHENSIVE METABOLIC PANEL
ALT: 31 U/L (ref 0–44)
AST: 28 U/L (ref 15–41)
Albumin: 3.5 g/dL (ref 3.5–5.0)
Alkaline Phosphatase: 202 U/L — ABNORMAL HIGH (ref 38–126)
Anion gap: 10 (ref 5–15)
BUN: 9 mg/dL (ref 6–20)
CO2: 23 mmol/L (ref 22–32)
Calcium: 9.7 mg/dL (ref 8.9–10.3)
Chloride: 103 mmol/L (ref 98–111)
Creatinine, Ser: 0.32 mg/dL — ABNORMAL LOW (ref 0.44–1.00)
GFR, Estimated: >60 mL/min (ref >60)
Glucose, Bld: 121 mg/dL — ABNORMAL HIGH (ref 70–99)
Potassium: 3.9 mmol/L (ref 3.5–5.1)
Sodium: 136 mmol/L (ref 135–145)
Total Bilirubin: 1 mg/dL (ref 0.3–1.2)
Total Protein: 6.9 g/dL (ref 6.5–8.1)

## 2020-12-12 LAB — I-STAT BETA HCG BLOOD, ED (MC, WL, AP ONLY): I-stat hCG, quantitative: 17.2 m[IU]/mL — ABNORMAL HIGH (ref <5)

## 2020-12-12 LAB — LIPASE, BLOOD: Lipase: 32 U/L (ref 11–51)

## 2020-12-12 MED ORDER — ALUM & MAG HYDROXIDE-SIMETH 200-200-20 MG/5ML PO SUSP
30.0000 mL | Freq: Once | ORAL | Status: AC
  Administered 2020-12-12: 30 mL via ORAL
  Filled 2020-12-12: qty 30

## 2020-12-12 MED ORDER — LACTATED RINGERS IV BOLUS
1000.0000 mL | Freq: Once | INTRAVENOUS | Status: AC
  Administered 2020-12-12: 1000 mL via INTRAVENOUS

## 2020-12-12 MED ORDER — LIDOCAINE VISCOUS HCL 2 % MT SOLN
15.0000 mL | Freq: Once | OROMUCOSAL | Status: AC
  Administered 2020-12-12: 15 mL via ORAL
  Filled 2020-12-12: qty 15

## 2020-12-12 MED ORDER — ONDANSETRON HCL 4 MG/2ML IJ SOLN
4.0000 mg | Freq: Once | INTRAMUSCULAR | Status: AC
  Administered 2020-12-12: 4 mg via INTRAVENOUS
  Filled 2020-12-12: qty 2

## 2020-12-12 NOTE — ED Triage Notes (Signed)
Pt c/o epigastric pain and N/V that began yesterday. Unable to tolerate PO intake. No relief with tylenol. Pain worsens after eating.

## 2020-12-12 NOTE — ED Provider Notes (Signed)
Emergency Medicine Provider Triage Evaluation Note  Robin Webster , a 30 y.o. female  was evaluated in triage.  Pt complains of upper abd pain nv that started last night.  Review of Systems  Positive: Abd pain, nv Negative: fever  Physical Exam  BP 114/69   Pulse (!) 114   Temp 98.3 F (36.8 C) (Oral)   Ht 5\' 2"  (1.575 m)   SpO2 100%   BMI 32.01 kg/m  Gen:   Awake, no distress   Resp:  Normal effort  MSK:   Moves extremities without difficulty  Other:  Epigastric and luq ttp  Medical Decision Making  Medically screening exam initiated at 6:55 PM.  Appropriate orders placed.  Kammie Scioli was informed that the remainder of the evaluation will be completed by another provider, this initial triage assessment does not replace that evaluation, and the importance of remaining in the ED until their evaluation is complete.     Wendie Agreste 12/12/20 12/14/20, MD 12/13/20 [phone]

## 2020-12-12 NOTE — ED Provider Notes (Signed)
[facility] DEPT Provider Note   CSN: [id number] Arrival date & time: 12/12/20  1830     History Chief Complaint  Patient presents with   Abdominal Pain   Emesis    Mardee Clune is a 30 y.o. female.  The history is provided by the patient.  Abdominal Pain Pain location:  Epigastric Pain quality: cramping, gnawing, sharp and shooting   Pain radiates to:  Does not radiate Pain severity:  Moderate Onset quality:  Gradual Duration:  2 days Timing:  Constant Progression:  Unchanged Chronicity:  New Context: eating   Context: not alcohol use, not awakening from sleep, not diet changes, not recent illness, not sick contacts and not suspicious food intake   Relieved by:  None tried Worsened by:  Eating Ineffective treatments:  None tried Associated symptoms: anorexia, nausea and vomiting   Associated symptoms: no constipation, no cough, no diarrhea, no dysuria and no fever   Associated symptoms comment:  Currently on menses Risk factors: no alcohol abuse, no aspirin use and no NSAID use   Emesis Associated symptoms: abdominal pain   Associated symptoms: no cough, no diarrhea and no fever       Past Medical History:  Diagnosis Date   Vaginal Pap smear, abnormal     Patient Active Problem List   Diagnosis Date Noted   Amniotic fluid leaking 05/06/2020   COVID-19 affecting pregnancy in third trimester 05/06/2020   Limited prenatal care 05/06/2020   Chlamydia infection affecting pregnancy in second trimester 11/24/2019   Supervision of normal pregnancy 11/10/2019   Vaginal delivery 07/24/2017    Past Surgical History:  Procedure Laterality Date   NO PAST SURGERIES       OB History     Gravida  3   Para  3   Term  3   Preterm      AB      Living  3      SAB      IAB      Ectopic      Multiple  0   Live Births  3        Obstetric Comments  1- [place] 2-[facility]         Family History  Problem Relation Age  of Onset   Hypertension Father     Social History   Tobacco Use   Smoking status: Former    Types: Cigarettes    Quit date: 01/26/2017    Years since quitting: 3.8   Smokeless tobacco: Never  Vaping Use   Vaping Use: Never used  Substance Use Topics   Alcohol use: Not Currently    Comment: occasionally   Drug use: Not Currently    Types: Marijuana    Comment: last used 2013     Home Medications Prior to Admission medications   Medication Sig Start Date End Date Taking? Authorizing Provider  ondansetron (ZOFRAN ODT) 4 MG disintegrating tablet Take 1 tablet (4 mg total) by mouth every 8 (eight) hours as needed for nausea or vomiting. 12/13/20  Yes Blanchie Dessert, MD  acetaminophen (TYLENOL) 325 MG tablet Take 2 tablets (650 mg total) by mouth every 4 (four) hours as needed (for pain scale < 4). Patient not taking: Reported on 12/12/2020 05/08/20   Janet Berlin, MD  amoxicillin (AMOXIL) 500 MG capsule Take 1 capsule (500 mg total) by mouth 3 (three) times daily. Patient not taking: Reported on 12/12/2020 03/20/20   Gabriel Carina, CNM  aspirin EC 81 MG tablet Take 1 tablet (81 mg total) by mouth daily. Swallow whole. Patient not taking: Reported on 12/12/2020 12/15/19   Rasch, Anderson Malta I, NP  Blood Pressure Monitoring (BLOOD PRESSURE KIT) DEVI 1 kit by Does not apply route once a week. Check Blood Pressure regularly and record readings into the [employer] App.  Large Cuff.  DX O90.0 Patient taking differently: 1 kit by Does not apply route once a week. Check Blood Pressure regularly and record readings into the [employer] App.  Large Cuff.  DX O90.0 11/10/19   Nugent, Gerrie Nordmann, NP  ferrous sulfate 325 (65 FE) MG tablet Take 1 tablet (325 mg total) by mouth every other day. Patient not taking: Reported on 12/12/2020 05/08/20   Janet Berlin, MD  ibuprofen (ADVIL) 600 MG tablet Take 1 tablet (600 mg total) by mouth every 6 (six) hours. Patient not taking: Reported on 12/12/2020  05/08/20   Janet Berlin, MD  Prenatal Vit-Fe Fumarate-FA (PRENATAL VITAMINS PO) Take by mouth. Patient not taking: Reported on 12/12/2020    [provider]  senna-docusate (SENOKOT-S) 8.6-50 MG tablet Take 2 tablets by mouth daily. Patient not taking: Reported on 12/12/2020 05/08/20   Janet Berlin, MD    Allergies    Patient has no known allergies.  Review of Systems   Review of Systems  Constitutional:  Negative for fever.  Respiratory:  Negative for cough.   Gastrointestinal:  Positive for abdominal pain, anorexia, nausea and vomiting. Negative for constipation and diarrhea.  Genitourinary:  Negative for dysuria.  All other systems reviewed and are negative.  Physical Exam Updated Vital Signs BP (!) 105/45   Pulse 100   Temp 98.3 F (36.8 C) (Oral)   Resp 18   Ht '5\' 2"'  (1.575 m)   Wt 79.4 kg   SpO2 98%   BMI 32.02 kg/m   Physical Exam Vitals and nursing note reviewed.  Constitutional:      General: She is not in acute distress.    Appearance: She is well-developed.  HENT:     Head: Normocephalic and atraumatic.  Eyes:     Conjunctiva/sclera: Conjunctivae normal.     Pupils: Pupils are equal, round, and reactive to light.  Cardiovascular:     Rate and Rhythm: Normal rate and regular rhythm.     Pulses: Normal pulses.     Heart sounds: Normal heart sounds. No murmur heard.   No friction rub.  Pulmonary:     Effort: Pulmonary effort is normal. No respiratory distress.     Breath sounds: Normal breath sounds. No wheezing or rales.  Abdominal:     General: Bowel sounds are normal. There is no distension.     Palpations: Abdomen is soft.     Tenderness: There is abdominal tenderness in the epigastric area. There is no guarding or rebound.  Musculoskeletal:        General: No tenderness. Normal range of motion.     Cervical back: Normal range of motion and neck supple.     Right lower leg: No edema.     Left lower leg: No edema.     Comments: No  edema  Skin:    General: Skin is warm and dry.     Findings: No erythema or rash.  Neurological:     Mental Status: She is alert and oriented to person, place, and time. Mental status is at baseline.     Cranial Nerves: No cranial nerve deficit.  Psychiatric:        Mood and Affect: Mood normal.        Behavior: Behavior normal.    ED Results / Procedures / Treatments   Labs (all labs ordered are listed, but only abnormal results are displayed) Labs Reviewed  CBC WITH DIFFERENTIAL/PLATELET - Abnormal; Notable for the following components:      Result Value   Hemoglobin 10.9 (*)    HCT 34.0 (*)    All other components within normal limits  COMPREHENSIVE METABOLIC PANEL - Abnormal; Notable for the following components:   Glucose, Bld 121 (*)    Creatinine, Ser 0.32 (*)    Alkaline Phosphatase 202 (*)    All other components within normal limits  URINALYSIS, ROUTINE W REFLEX MICROSCOPIC - Abnormal; Notable for the following components:   Specific Gravity, Urine >1.030 (*)    Hgb urine dipstick SMALL (*)    All other components within normal limits  I-STAT BETA HCG BLOOD, ED ([facility], [facility], [facility] ONLY) - Abnormal; Notable for the following components:   I-stat hCG, quantitative 17.2 (*)    All other components within normal limits  LIPASE, BLOOD  PREGNANCY, URINE    EKG None  Radiology No results found.  Procedures Procedures   Medications Ordered in ED Medications  ondansetron (ZOFRAN) injection 4 mg (4 mg Intravenous Given 12/12/20 2250)  lactated ringers bolus 1,000 mL (1,000 mLs Intravenous New Bag/Given 12/12/20 2251)  alum & mag hydroxide-simeth (MAALOX/MYLANTA) 200-200-20 MG/5ML suspension 30 mL (30 mLs Oral Given 12/12/20 2251)    And  lidocaine (XYLOCAINE) 2 % viscous mouth solution 15 mL (15 mLs Oral Given 12/12/20 2251)    ED Course  I have reviewed the triage vital signs and the nursing notes.  Pertinent labs & imaging results that were available during my care of  the patient were reviewed by me and considered in my medical decision making (see chart for details).    MDM Rules/Calculators/A&P                           Patient is a 30 year old female with no known medical problems presenting today with 2-day history of epigastric pain, nausea and vomiting.  This started spontaneously.  Denies any recent travel, bad food exposure, sick contacts.  Pain is located solely in the epigastric area.  She denies any diarrhea or fever.  She does not drink alcohol and denies regular or excessive NSAID use.  Well-appearing on exam with normal vital signs.  Epigastric tenderness but no right upper quadrant tenderness to suggest cholelithiasis.  No lower abdominal pain to suggest appendicitis, diverticulitis or acute pelvic pathology.  He denies any urinary symptoms at this time.  No flank pain.  CBC, lipase, UA are all within normal limits.  Beta hCG was 17 but suspect a false positive as patient is currently on her menses.  We will get a urine pregnancy.  We will give patient IV fluids, nausea medication and GI cocktail.  Will reevaluate.  Suspect an acute gastritis most likely from viral etiology.  12:33 AM UA without acute findings and urine pregnancy test is negative.  After meds patient is feeling better and eating and drinking without any difficulty.  She reports the pain is gone.  MDM   Amount and/or Complexity of Data Reviewed Clinical lab tests: ordered and reviewed Independent visualization of images, tracings, or specimens: yes    Final Clinical Impression(s) / ED Diagnoses Final diagnoses:  Intractable vomiting with nausea, unspecified vomiting type  Acute gastritis without hemorrhage, unspecified gastritis type    Rx / DC Orders ED Discharge Orders          Ordered    ondansetron (ZOFRAN ODT) 4 MG disintegrating tablet  Every 8 hours PRN        12/13/20 5051             Blanchie Dessert, MD 12/13/20 0712

## 2020-12-13 LAB — PREGNANCY, URINE: Preg Test, Ur: NEGATIVE

## 2020-12-13 MED ORDER — ONDANSETRON 4 MG PO TBDP: 4.0000 mg | ORAL_TABLET | Freq: Three times a day (TID) | ORAL | 0 refills | Status: AC | PRN

## 2020-12-13 NOTE — Discharge Instructions (Signed)
All the blood work today looked normal.  May be that you just reacted to something you ate or had a bug.  You can try Maalox and the nausea medicine as needed.  If the pain returns or becomes severe you get a fever or have any blood in what you are vomiting you should return to the emergency room immediately.

## 2021-04-09 ENCOUNTER — Encounter: Payer: Self-pay | Admitting: Emergency Medicine

## 2021-04-09 ENCOUNTER — Emergency Department
Admission: EM | Admit: 2021-04-09 | Discharge: 2021-04-09 | Disposition: A | Discharge status: Home / Self Care | Payer: Medicaid Other | Attending: Emergency Medicine | Admitting: Emergency Medicine

## 2021-04-09 ENCOUNTER — Other Ambulatory Visit: Payer: Self-pay

## 2021-04-09 DIAGNOSIS — R000 Tachycardia, unspecified: Secondary | ICD-10-CM | POA: Diagnosis present

## 2021-04-09 DIAGNOSIS — E0590 Thyrotoxicosis, unspecified without thyrotoxic crisis or storm: Secondary | ICD-10-CM | POA: Insufficient documentation

## 2021-04-09 LAB — CBC
HCT: 34.2 % — ABNORMAL LOW (ref 36.0–46.0)
Hemoglobin: 10.8 g/dL — ABNORMAL LOW (ref 12.0–15.0)
MCH: 26.2 pg (ref 26.0–34.0)
MCHC: 31.6 g/dL (ref 30.0–36.0)
MCV: 82.8 fL (ref 80.0–100.0)
Platelets: 272 10*3/uL (ref 150–400)
RBC: 4.13 MIL/uL (ref 3.87–5.11)
RDW: 13 % (ref 11.5–15.5)
WBC: 9.2 10*3/uL (ref 4.0–10.5)
nRBC: 0 % (ref 0.0–0.2)

## 2021-04-09 LAB — BASIC METABOLIC PANEL
Anion gap: 7 (ref 5–15)
BUN: 13 mg/dL (ref 6–20)
CO2: 22 mmol/L (ref 22–32)
Calcium: 9 mg/dL (ref 8.9–10.3)
Chloride: 102 mmol/L (ref 98–111)
Creatinine, Ser: 0.39 mg/dL — ABNORMAL LOW (ref 0.44–1.00)
GFR, Estimated: >60 mL/min (ref >60)
Glucose, Bld: 82 mg/dL (ref 70–99)
Potassium: 3.4 mmol/L — ABNORMAL LOW (ref 3.5–5.1)
Sodium: 131 mmol/L — ABNORMAL LOW (ref 135–145)

## 2021-04-09 LAB — POC URINE PREG, ED: Preg Test, Ur: NEGATIVE

## 2021-04-09 LAB — TROPONIN I (HIGH SENSITIVITY)
Troponin I (High Sensitivity): 5 ng/L (ref <18)
Troponin I (High Sensitivity): 7 ng/L (ref <18)

## 2021-04-09 LAB — MAGNESIUM: Magnesium: 1.6 mg/dL — ABNORMAL LOW (ref 1.7–2.4)

## 2021-04-09 LAB — TSH: TSH: <0.01 u[IU]/mL — ABNORMAL LOW (ref 0.350–4.500)

## 2021-04-09 LAB — T4, FREE: Free T4: >5.5 ng/dL — ABNORMAL HIGH (ref 0.61–1.12)

## 2021-04-09 MED ORDER — ATENOLOL 25 MG PO TABS: 25.0000 mg | ORAL_TABLET | Freq: Every day | ORAL | 0 refills | Status: AC

## 2021-04-09 MED ORDER — POTASSIUM CHLORIDE CRYS ER 20 MEQ PO TBCR
40.0000 meq | EXTENDED_RELEASE_TABLET | Freq: Once | ORAL | Status: AC
  Administered 2021-04-09: 40 meq via ORAL
  Filled 2021-04-09: qty 2

## 2021-04-09 MED ORDER — METHIMAZOLE 10 MG PO TABS: 10.0000 mg | ORAL_TABLET | Freq: Three times a day (TID) | ORAL | 0 refills | Status: AC

## 2021-04-09 MED ORDER — ATENOLOL 25 MG PO TABS
25.0000 mg | ORAL_TABLET | Freq: Once | ORAL | Status: AC
  Administered 2021-04-09: 25 mg via ORAL
  Filled 2021-04-09: qty 1

## 2021-04-09 MED ORDER — SODIUM CHLORIDE 0.9 % IV BOLUS
1000.0000 mL | Freq: Once | INTRAVENOUS | Status: AC
  Administered 2021-04-09: 1000 mL via INTRAVENOUS

## 2021-04-09 MED ORDER — MAGNESIUM SULFATE 2 GM/50ML IV SOLN
2.0000 g | Freq: Once | INTRAVENOUS | Status: AC
  Administered 2021-04-09: 2 g via INTRAVENOUS
  Filled 2021-04-09: qty 50

## 2021-04-09 NOTE — ED Provider Notes (Addendum)
[facility] Provider Note    Event Date/Time   First MD Initiated Contact with Patient 04/09/21 [phone]     (approximate)   History   Tachycardia   HPI  Robin Webster is a 31 y.o. female who reports being otherwise healthy who comes in with concerns for tachycardia.  Patient reports that she is not on any medications.  She states that around 4 AM she started to feel dizzy and felt like her heart was beating faster.  Denies this ever happening previously.  She reports that her symptoms have mostly resolved at this time.  Denies any chest pain, shortness of breath, pleuritic chest pain, abdominal pain, unilateral leg swelling, estrogen use.  Does report that her period is a little bit late and is sexually active.  However she states that sometimes it can be a few days late.  She reports that she nearly feels back to her baseline at this time.  Does report a little bit of history of anxiety.  States that her last pregnancy was back in 2020. Denies any SOB, chest pain, leg swelling.   Physical Exam   Triage Vital Signs: ED Triage Vitals  Enc Vitals Group     BP 04/09/21 0645 126/78     Pulse Rate 04/09/21 0645 (!) 110     Resp 04/09/21 0645 16     Temp 04/09/21 0645 98.3 F (36.8 C)     Temp Source 04/09/21 0645 Oral     SpO2 04/09/21 0645 100 %     Weight 04/09/21 0646 135 lb (61.2 kg)     Height 04/09/21 0646 5\' 3"  (1.6 m)     Head Circumference --      Peak Flow --      Pain Score 04/09/21 0646 0     Pain Loc --      Pain Edu? --      Excl. in [place]? --     Most recent vital signs: Vitals:   04/09/21 0645  BP: 126/78  Pulse: (!) 110  Resp: 16  Temp: 98.3 F (36.8 C)  SpO2: 100%     General: Awake, no distress.  Well-appearing female CV:  Good peripheral perfusion.  Tachycardic Resp:  Normal effort.  Clear lung Abd:  No distention.  Soft and nontender Other:  No leg swelling noted, no calf tenderness.   ED Results / Procedures / Treatments    Labs (all labs ordered are listed, but only abnormal results are displayed) Labs Reviewed  CBC - Abnormal; Notable for the following components:      Result Value   Hemoglobin 10.8 (*)    HCT 34.2 (*)    All other components within normal limits  BASIC METABOLIC PANEL  TSH  T4, FREE  MAGNESIUM  POC URINE PREG, ED  TROPONIN I (HIGH SENSITIVITY)     EKG  My interpretation of EKG:  Sinus tachycardia rate of 111 without any ST elevation or T wave inversions, normal intervals     PROCEDURES:  Critical Care performed: No  .1-3 Lead EKG Interpretation Performed by: Vanessa [place], MD Authorized by: Vanessa Wright, MD     Interpretation: abnormal     ECG rate:  118   ECG rate assessment: tachycardic     Rhythm: sinus tachycardia     Ectopy: none     Conduction: normal     MEDICATIONS ORDERED IN ED: Medications  magnesium sulfate IVPB 2 g 50 mL (2 g  Intravenous New Bag/Given 04/09/21 0859)  sodium chloride 0.9 % bolus 1,000 mL (0 mLs Intravenous Stopped 04/09/21 0848)  potassium chloride SA (KLOR-CON M) CR tablet 40 mEq (40 mEq Oral Given 04/09/21 0854)  atenolol (TENORMIN) tablet 25 mg (25 mg Oral Given 04/09/21 0954)     IMPRESSION / MDM / ASSESSMENT AND PLAN / ED COURSE  I reviewed the triage vital signs and the nursing notes.   Differential diagnosis includes, but is not limited to, Electra abnormalities, thyroid dysfunction, ACS seems less likely.  Denies any other infectious symptoms.  Low suspicion for dehydration we will give a liter of fluid while waiting for results  Pregnancy test was negative.  Cardiac markers are negative x2.  Sodium slightly low but patient is getting fluid resuscitation.  Potassium low will give some oral repletion.  Her hemoglobin is stable from her baseline.  Her repeat troponin was also negative.  Her TSH was significantly low and her T4 was elevated this is concerning for hypothyroidism. I considered admission but Patient does not have  signs of thyroid storm given she is afebrile without any central nervous system effects or GI effects.  I calculated her burch diagnostic criteria and her score is 15 therefore this can be managed outpatient as given is unlikely to represent thyroid storm.  On reevaluation patient remains without symptoms.  Remains slightly tachycardic we will give her her first dose of atenolol here and start her on methimazole and atenolol low doses.  Explained her that they would probably need to be adjusted further but she can start with this.  She expressed understanding.  She understands the importance of following up with an endocrinologist.  I have listed a name for her or told her to follow-up with a primary care doctor if she cannot get follow-up soon.  Patient expressed understanding felt comfortable with this plan.   The patient is on the cardiac monitor to evaluate for evidence of arrhythmia and/or significant heart rate changes.     FINAL CLINICAL IMPRESSION(S) / ED DIAGNOSES   Final diagnoses:  Hyperthyroidism  Tachycardia     Rx / DC Orders   ED Discharge Orders          Ordered    methimazole (TAPAZOLE) 10 MG tablet  3 times daily        04/09/21 0947    atenolol (TENORMIN) 25 MG tablet  Daily        04/09/21 0947             Note:  This document was prepared using Dragon voice recognition software and may include unintentional dictation errors.   Vanessa Cimarron, MD 04/09/21 1000    Vanessa Seguin, MD 04/10/21 [phone]

## 2021-04-09 NOTE — ED Notes (Signed)
Pt stated she still feels pretty fair.

## 2021-04-09 NOTE — ED Triage Notes (Signed)
Pt states approx one hour pta began to experience dizziness and sensation of tachycardia. Pt states symptoms have resolved considerably. Pt denies shob, fever, nausea, diphoresis.

## 2021-04-09 NOTE — ED Notes (Signed)
Pt caox 4, pearrl,  Pt stated she felt pretty good presently. Pt stated she has not had any reccurent s/s.

## 2021-04-09 NOTE — Discharge Instructions (Addendum)
Do not get pregnant while using these medications.   Call the endocrine doctor to get follow-up.  States that you were in the ER the ER follow-up due to tachycardia with hyperthyroidism.  If you cannot get seen by them in the next few days follow-up with your primary care doctor so they can recheck your heart rate and titrate your medications if needed.  Return to ER if develop worsening symptoms or any other concerns such as fevers, confusion etc.

## 2021-07-02 ENCOUNTER — Ambulatory Visit (HOSPITAL_COMMUNITY)
Admission: EM | Admit: 2021-07-02 | Discharge: 2021-07-02 | Disposition: A | Discharge status: Home / Self Care | Payer: Medicaid Other | Attending: Student | Admitting: Student

## 2021-07-02 ENCOUNTER — Encounter (HOSPITAL_COMMUNITY): Payer: Self-pay | Admitting: *Deleted

## 2021-07-02 ENCOUNTER — Other Ambulatory Visit: Payer: Self-pay

## 2021-07-02 DIAGNOSIS — K0889 Other specified disorders of teeth and supporting structures: Secondary | ICD-10-CM | POA: Diagnosis not present

## 2021-07-02 DIAGNOSIS — E0590 Thyrotoxicosis, unspecified without thyrotoxic crisis or storm: Secondary | ICD-10-CM

## 2021-07-02 MED ORDER — LIDOCAINE VISCOUS HCL 2 % MT SOLN: 15.0000 mL | OROMUCOSAL | 0 refills | Status: AC | PRN

## 2021-07-02 MED ORDER — AMOXICILLIN-POT CLAVULANATE 875-125 MG PO TABS: 1.0000 | ORAL_TABLET | Freq: Two times a day (BID) | ORAL | 0 refills | Status: AC

## 2021-07-02 MED ORDER — TRAMADOL HCL 50 MG PO TABS: 50.0000 mg | ORAL_TABLET | Freq: Four times a day (QID) | ORAL | 0 refills | Status: AC | PRN

## 2021-07-02 NOTE — ED Provider Notes (Signed)
?[facility] ? ? ? ?CSN: [id number] ?Arrival date & time: 07/02/21  1910 ? ? ?  ? ?History   ?Chief Complaint ?Chief Complaint  ?Patient presents with  ? Dental Pain  ? ? ?HPI ?Robin Webster is a 31 y.o. female presenting with dental pain for 4 days.  History noncontributory.  Patient describes left lower dental pain surrounding broken teeth for 4 days.  States she has attempted over-the-counter pain relievers with minimal relief.  Her dentist could not fit her in until Thursday 4/20.  Denies foul taste in mouth, pain under tongue, pain under jaw, sore throat, trouble swallowing, fever/chills. ? ?HPI ? ?Past Medical History:  ?Diagnosis Date  ? Vaginal Pap smear, abnormal   ? ? ?Patient Active Problem List  ? Diagnosis Date Noted  ? Amniotic fluid leaking 05/06/2020  ? COVID-19 affecting pregnancy in third trimester 05/06/2020  ? Limited prenatal care 05/06/2020  ? Chlamydia infection affecting pregnancy in second trimester 11/24/2019  ? Supervision of normal pregnancy 11/10/2019  ? Vaginal delivery 07/24/2017  ? ? ?Past Surgical History:  ?Procedure Laterality Date  ? NO PAST SURGERIES    ? ? ?OB History   ? ? Gravida  ?3  ? Para  ?3  ? Term  ?3  ? Preterm  ?   ? AB  ?   ? Living  ?3  ?  ? ? SAB  ?   ? IAB  ?   ? Ectopic  ?   ? Multiple  ?0  ? Live Births  ?3  ?   ?  ? Obstetric Comments  ?1- [place]  ?  ? ?  ? ? ? ?Home Medications   ? ?Prior to Admission medications   ?Medication Sig Start Date End Date Taking? Authorizing Provider  ?amoxicillin-clavulanate (AUGMENTIN) 875-125 MG tablet Take 1 tablet by mouth every 12 (twelve) hours. 07/02/21  Yes Hazel Sams, PA-C  ?lidocaine (XYLOCAINE) 2 % solution Use as directed 15 mLs in the mouth or throat every 4 (four) hours as needed for mouth pain. Swish/gargle and spit 07/02/21  Yes Hazel Sams, PA-C  ?traMADol (ULTRAM) 50 MG tablet Take 1 tablet (50 mg total) by mouth every 6 (six) hours as needed. 07/02/21  Yes Hazel Sams, PA-C  ?atenolol  (TENORMIN) 25 MG tablet Take 1 tablet (25 mg total) by mouth daily for 14 days. 04/09/21 04/23/21  Vanessa Big Beaver, MD  ?Blood Pressure Monitoring (BLOOD PRESSURE KIT) DEVI 1 kit by Does not apply route once a week. Check Blood Pressure regularly and record readings into the [employer] App.  Large Cuff.  DX O90.0 ?Patient taking differently: 1 kit by Does not apply route once a week. Check Blood Pressure regularly and record readings into the [employer] App.  Large Cuff.  DX O90.0 11/10/19   Nugent, Gerrie Nordmann, NP  ?methimazole (TAPAZOLE) 10 MG tablet Take 1 tablet (10 mg total) by mouth 3 (three) times daily for 14 days. 04/09/21 04/23/21  Vanessa Pacheco, MD  ?ondansetron (ZOFRAN ODT) 4 MG disintegrating tablet Take 1 tablet (4 mg total) by mouth every 8 (eight) hours as needed for nausea or vomiting. 12/13/20   Blanchie Dessert, MD  ? ? ?Family History ?Family History  ?Problem Relation Age of Onset  ? Hypertension Father   ? ? ?Social History ?Social History  ? ?Tobacco Use  ? Smoking status: Former  ?  Types: Cigarettes  ?  Quit date: 01/26/2017  ?  Years  since quitting: 4.4  ? Smokeless tobacco: Never  ?Vaping Use  ? Vaping Use: Never used  ?Substance Use Topics  ? Alcohol use: Not Currently  ?  Comment: occasionally  ? Drug use: Not Currently  ?  Types: Marijuana  ?  Comment: last used 2013   ? ? ? ?Allergies   ?Patient has no known allergies. ? ? ?Review of Systems ?Review of Systems  ?HENT:  Positive for dental problem.   ?All other systems reviewed and are negative. ? ? ?Physical Exam ?Triage Vital Signs ?ED Triage Vitals  ?Enc Vitals Group  ?   BP 07/02/21 1959 133/80  ?   Pulse Rate 07/02/21 1959 (!) 118  ?   Resp 07/02/21 1959 18  ?   Temp 07/02/21 1959 98.7 ?F (37.1 ?C)  ?   Temp src --   ?   SpO2 07/02/21 1959 98 %  ?   Weight --   ?   Height --   ?   Head Circumference --   ?   Peak Flow --   ?   Pain Score 07/02/21 1957 10  ?   Pain Loc --   ?   Pain Edu? --   ?   Excl. in [place]? --   ? ?No data found. ? ?Updated  Vital Signs ?BP 133/80   Pulse (!) 118   Temp 98.7 ?F (37.1 ?C)   Resp 18   LMP 06/21/2021   SpO2 98%  ? ?Visual Acuity ?Right Eye Distance:   ?Left Eye Distance:   ?Bilateral Distance:   ? ?Right Eye Near:   ?Left Eye Near:    ?Bilateral Near:    ? ?Physical Exam ?Vitals reviewed.  ?Constitutional:   ?   General: She is not in acute distress. ?   Appearance: Normal appearance. She is not ill-appearing, toxic-appearing or diaphoretic.  ?HENT:  ?   Head: Normocephalic and atraumatic.  ?   Jaw: There is normal jaw occlusion. No trismus, tenderness, swelling, pain on movement or malocclusion.  ?   Salivary Glands: Right salivary gland is not diffusely enlarged or tender. Left salivary gland is not diffusely enlarged or tender.  ?   Right Ear: Hearing normal.  ?   Left Ear: Hearing normal.  ?   Nose: Nose normal.  ?   Mouth/Throat:  ?   Lips: Pink.  ?   Mouth: Mucous membranes are moist. No lacerations or oral lesions.  ?   Dentition: Abnormal dentition. Does not have dentures. Dental tenderness and dental caries present.  ?   Tongue: No lesions. Tongue does not deviate from midline.  ?   Palate: No mass.  ?   Pharynx: Oropharynx is clear. Uvula midline. No oropharyngeal exudate or posterior oropharyngeal erythema.  ?   Tonsils: No tonsillar exudate or tonsillar abscesses.  ?   Comments: Poor dentician  ?Gingival tenderness without abscess surrounding L lower molars, which are broken with cavities. No trismus, drooling, sore throat, voice changes, swelling underneath the tongue, swelling underneath the jaw, neck stiffness. ? ?Eyes:  ?   Extraocular Movements: Extraocular movements intact.  ?   Pupils: Pupils are equal, round, and reactive to light.  ?Cardiovascular:  ?   Rate and Rhythm: Tachycardia present.  ?Pulmonary:  ?   Effort: Pulmonary effort is normal.  ?Neurological:  ?   General: No focal deficit present.  ?   Mental Status: She is alert and oriented to person, place, and time.  ?  Psychiatric:     ?    Mood and Affect: Mood normal.     ?   Behavior: Behavior normal.     ?   Thought Content: Thought content normal.     ?   Judgment: Judgment normal.  ? ? ? ?UC Treatments / Results  ?Labs ?(all labs ordered are listed, but only abnormal results are displayed) ?Labs Reviewed - No data to display ? ?EKG ? ? ?Radiology ?No results found. ? ?Procedures ?Procedures (including critical care time) ? ?Medications Ordered in UC ?Medications - No data to display ? ?Initial Impression / Assessment and Plan / UC Course  ?I have reviewed the triage vital signs and the nursing notes. ? ?Pertinent labs & imaging results that were available during my care of the patient were reviewed by me and considered in my medical decision making (see chart for details). ? ?  ? ?This patient is a very pleasant 31 y.o. year old female presenting with dental pain. Afebrile. Tachycardic at 118, same as her visit to the ED 2 months ago. Pt with hyperthyroid taking methimazole twice daily and atenolol bid, but has not followed with endo yet. She denies current CP, SOB. Low concern for Ludwig's as she does not have any pain under her tongue or jaw, no fevers, no trismus. Currently menstruating, States she is not pregnant or breastfeeding. ? ?Augmentin and viscous lidocaine sent. As she has maxed out over-the-counter pain medications already I also sent a short course of tramadol for pain. She will follow-up with dentist as scheduled on 07/05/21.  ? ?ED return precautions discussed. Patient verbalizes understanding and agreement.  ? ?Coding Level 4 for acute illness with systemic symptoms, and prescription drug management ? ? ?Final Clinical Impressions(s) / UC Diagnoses  ? ?Final diagnoses:  ?Pain, dental  ?Hyperthyroidism  ? ? ? ?Discharge Instructions   ? ?  ?-Start the antibiotic-Augmentin (amoxicillin-clavulanate), 1 pill every 12 hours for 7 days.  You can take this with food like with breakfast and dinner. ?-Tramadol for pain, up to every 6  hours. This medication can cause drowsiness, so don't take before driving. Don't drink alcohol while on this medication. ?-You can take ibuprofen and tylenol for additional relief:  ?-You can take Tylenol up to 1

## 2021-07-02 NOTE — ED Triage Notes (Signed)
Pt reports dental pain since Friday. Pt attempted to do a walk in visit with her dentist but can not be seen until Thursday. ?

## 2021-07-02 NOTE — Discharge Instructions (Addendum)
-  Start the antibiotic-Augmentin (amoxicillin-clavulanate), 1 pill every 12 hours for 7 days.  You can take this with food like with breakfast and dinner. ?-Tramadol for pain, up to every 6 hours. This medication can cause drowsiness, so don't take before driving. Don't drink alcohol while on this medication. ?-You can take ibuprofen and tylenol for additional relief:  ?-You can take Tylenol up to 1000 mg 3 times daily, and ibuprofen up to 600 mg 3 times daily with food.  You can take these together, or alternate every 3-4 hours. ?-For dental pain, use lidocaine mouthwash up to every 4 hours. Make sure not to eat for at least 1 hour after using this, as your mouth will be very numb and you could bite yourself. ?-Follow-up with your dentist as scheduled 07/05/21 ?

## 2021-08-01 ENCOUNTER — Ambulatory Visit (HOSPITAL_COMMUNITY)
Admission: EM | Admit: 2021-08-01 | Discharge: 2021-08-01 | Discharge status: Against Medical Advice | Payer: Medicaid Other
